# Patient Record
Sex: Male | Born: 1935 | Race: White | Hispanic: No | Marital: Married | Smoking: Never smoker
Health system: Southern US, Community
[De-identification: ages and names within clinical notes are randomized; demographics above are authoritative.]

## PROBLEM LIST (undated history)

## (undated) DIAGNOSIS — K4090 Unilateral inguinal hernia, without obstruction or gangrene, not specified as recurrent: Secondary | ICD-10-CM

## (undated) DIAGNOSIS — M720 Palmar fascial fibromatosis [Dupuytren]: Secondary | ICD-10-CM

## (undated) DIAGNOSIS — K219 Gastro-esophageal reflux disease without esophagitis: Secondary | ICD-10-CM

## (undated) DIAGNOSIS — J309 Allergic rhinitis, unspecified: Secondary | ICD-10-CM

## (undated) DIAGNOSIS — M47817 Spondylosis without myelopathy or radiculopathy, lumbosacral region: Secondary | ICD-10-CM

## (undated) DIAGNOSIS — E785 Hyperlipidemia, unspecified: Secondary | ICD-10-CM

## (undated) DIAGNOSIS — N189 Chronic kidney disease, unspecified: Secondary | ICD-10-CM

## (undated) DIAGNOSIS — I10 Essential (primary) hypertension: Secondary | ICD-10-CM

## (undated) DIAGNOSIS — B029 Zoster without complications: Secondary | ICD-10-CM

## (undated) DIAGNOSIS — I509 Heart failure, unspecified: Secondary | ICD-10-CM

## (undated) DIAGNOSIS — M4120 Other idiopathic scoliosis, site unspecified: Secondary | ICD-10-CM

## (undated) DIAGNOSIS — G629 Polyneuropathy, unspecified: Secondary | ICD-10-CM

## (undated) DIAGNOSIS — K648 Other hemorrhoids: Secondary | ICD-10-CM

## (undated) DIAGNOSIS — I2510 Atherosclerotic heart disease of native coronary artery without angina pectoris: Secondary | ICD-10-CM

## (undated) DIAGNOSIS — R7303 Prediabetes: Secondary | ICD-10-CM

## (undated) DIAGNOSIS — C61 Malignant neoplasm of prostate: Secondary | ICD-10-CM

## (undated) DIAGNOSIS — M109 Gout, unspecified: Secondary | ICD-10-CM

## (undated) HISTORY — DX: Zoster without complications: B02.9

## (undated) HISTORY — DX: Other idiopathic scoliosis, site unspecified: M41.20

## (undated) HISTORY — DX: Heart failure, unspecified: I50.9

## (undated) HISTORY — DX: Essential (primary) hypertension: I10

## (undated) HISTORY — PX: VASECTOMY: SHX75

## (undated) HISTORY — DX: Palmar fascial fibromatosis (dupuytren): M72.0

## (undated) HISTORY — DX: Spondylosis without myelopathy or radiculopathy, lumbosacral region: M47.817

## (undated) HISTORY — DX: Allergic rhinitis, unspecified: J30.9

## (undated) HISTORY — DX: Chronic kidney disease, unspecified: N18.9

## (undated) HISTORY — DX: Gout, unspecified: M10.9

## (undated) HISTORY — PX: HERNIA REPAIR: SHX51

## (undated) HISTORY — DX: Atherosclerotic heart disease of native coronary artery without angina pectoris: I25.10

## (undated) HISTORY — DX: Gastro-esophageal reflux disease without esophagitis: K21.9

## (undated) HISTORY — DX: Other hemorrhoids: K64.8

## (undated) HISTORY — DX: Malignant neoplasm of prostate: C61

## (undated) HISTORY — PX: EYE SURGERY: SHX253

## (undated) HISTORY — DX: Unilateral inguinal hernia, without obstruction or gangrene, not specified as recurrent: K40.90

## (undated) HISTORY — DX: Hyperlipidemia, unspecified: E78.5

## (undated) HISTORY — PX: TYMPANIC MEMBRANE REPAIR: SHX294

---

## 1999-12-16 DIAGNOSIS — J3089 Other allergic rhinitis: Secondary | ICD-10-CM | POA: Insufficient documentation

## 2000-01-07 DIAGNOSIS — M1A0710 Idiopathic chronic gout, right ankle and foot, without tophus (tophi): Secondary | ICD-10-CM | POA: Insufficient documentation

## 2000-09-04 DIAGNOSIS — K219 Gastro-esophageal reflux disease without esophagitis: Secondary | ICD-10-CM

## 2001-10-27 DIAGNOSIS — E785 Hyperlipidemia, unspecified: Secondary | ICD-10-CM

## 2001-10-27 DIAGNOSIS — E7800 Pure hypercholesterolemia, unspecified: Secondary | ICD-10-CM | POA: Insufficient documentation

## 2001-11-17 DIAGNOSIS — K635 Polyp of colon: Secondary | ICD-10-CM | POA: Insufficient documentation

## 2005-06-01 DIAGNOSIS — K4090 Unilateral inguinal hernia, without obstruction or gangrene, not specified as recurrent: Secondary | ICD-10-CM | POA: Insufficient documentation

## 2005-09-01 DIAGNOSIS — B029 Zoster without complications: Secondary | ICD-10-CM | POA: Insufficient documentation

## 2005-10-17 DIAGNOSIS — M720 Palmar fascial fibromatosis [Dupuytren]: Secondary | ICD-10-CM | POA: Insufficient documentation

## 2006-11-16 DIAGNOSIS — K648 Other hemorrhoids: Secondary | ICD-10-CM | POA: Insufficient documentation

## 2009-08-29 DIAGNOSIS — Z8719 Personal history of other diseases of the digestive system: Secondary | ICD-10-CM | POA: Insufficient documentation

## 2009-08-29 DIAGNOSIS — Z9852 Vasectomy status: Secondary | ICD-10-CM | POA: Insufficient documentation

## 2009-08-29 DIAGNOSIS — Z9622 Myringotomy tube(s) status: Secondary | ICD-10-CM | POA: Insufficient documentation

## 2009-08-31 DIAGNOSIS — I10 Essential (primary) hypertension: Secondary | ICD-10-CM | POA: Diagnosis present

## 2010-12-16 DIAGNOSIS — Z79899 Other long term (current) drug therapy: Secondary | ICD-10-CM | POA: Insufficient documentation

## 2011-05-04 DIAGNOSIS — M0600 Rheumatoid arthritis without rheumatoid factor, unspecified site: Secondary | ICD-10-CM | POA: Insufficient documentation

## 2012-01-01 DIAGNOSIS — H903 Sensorineural hearing loss, bilateral: Secondary | ICD-10-CM | POA: Insufficient documentation

## 2012-01-01 DIAGNOSIS — H6120 Impacted cerumen, unspecified ear: Secondary | ICD-10-CM | POA: Insufficient documentation

## 2013-02-02 DIAGNOSIS — M419 Scoliosis, unspecified: Secondary | ICD-10-CM | POA: Insufficient documentation

## 2013-02-02 DIAGNOSIS — M47816 Spondylosis without myelopathy or radiculopathy, lumbar region: Secondary | ICD-10-CM | POA: Insufficient documentation

## 2013-03-17 DIAGNOSIS — C61 Malignant neoplasm of prostate: Secondary | ICD-10-CM | POA: Insufficient documentation

## 2013-04-13 DIAGNOSIS — M1990 Unspecified osteoarthritis, unspecified site: Secondary | ICD-10-CM | POA: Insufficient documentation

## 2013-04-13 DIAGNOSIS — C7951 Secondary malignant neoplasm of bone: Secondary | ICD-10-CM | POA: Insufficient documentation

## 2013-05-31 DIAGNOSIS — D709 Neutropenia, unspecified: Secondary | ICD-10-CM | POA: Insufficient documentation

## 2013-11-09 DIAGNOSIS — R600 Localized edema: Secondary | ICD-10-CM | POA: Insufficient documentation

## 2014-01-19 DIAGNOSIS — S62102A Fracture of unspecified carpal bone, left wrist, initial encounter for closed fracture: Secondary | ICD-10-CM | POA: Insufficient documentation

## 2015-04-12 DIAGNOSIS — M7022 Olecranon bursitis, left elbow: Secondary | ICD-10-CM | POA: Insufficient documentation

## 2015-05-07 DIAGNOSIS — K5900 Constipation, unspecified: Secondary | ICD-10-CM | POA: Insufficient documentation

## 2016-05-16 DIAGNOSIS — N1832 Chronic kidney disease, stage 3b: Secondary | ICD-10-CM | POA: Insufficient documentation

## 2016-09-26 DIAGNOSIS — M160 Bilateral primary osteoarthritis of hip: Secondary | ICD-10-CM | POA: Insufficient documentation

## 2018-08-29 DIAGNOSIS — R7303 Prediabetes: Secondary | ICD-10-CM | POA: Insufficient documentation

## 2018-10-01 DIAGNOSIS — R000 Tachycardia, unspecified: Secondary | ICD-10-CM | POA: Insufficient documentation

## 2019-09-28 DIAGNOSIS — I491 Atrial premature depolarization: Secondary | ICD-10-CM | POA: Insufficient documentation

## 2019-09-28 DIAGNOSIS — I493 Ventricular premature depolarization: Secondary | ICD-10-CM | POA: Insufficient documentation

## 2019-10-07 ENCOUNTER — Encounter: Payer: Self-pay | Admitting: Cardiology

## 2019-11-01 ENCOUNTER — Encounter: Payer: Self-pay | Admitting: *Deleted

## 2019-11-02 ENCOUNTER — Ambulatory Visit (INDEPENDENT_AMBULATORY_CARE_PROVIDER_SITE_OTHER): Payer: Medicare Other | Admitting: Cardiology

## 2019-11-02 ENCOUNTER — Telehealth: Payer: Self-pay | Admitting: Cardiology

## 2019-11-02 ENCOUNTER — Other Ambulatory Visit (INDEPENDENT_AMBULATORY_CARE_PROVIDER_SITE_OTHER): Payer: Medicare Other

## 2019-11-02 ENCOUNTER — Other Ambulatory Visit: Payer: Self-pay

## 2019-11-02 ENCOUNTER — Encounter: Payer: Self-pay | Admitting: Cardiology

## 2019-11-02 ENCOUNTER — Encounter: Payer: Self-pay | Admitting: *Deleted

## 2019-11-02 VITALS — BP 150/78 | HR 50 | Ht 71.0 in | Wt 224.0 lb

## 2019-11-02 DIAGNOSIS — I493 Ventricular premature depolarization: Secondary | ICD-10-CM

## 2019-11-02 DIAGNOSIS — I10 Essential (primary) hypertension: Secondary | ICD-10-CM

## 2019-11-02 DIAGNOSIS — R0602 Shortness of breath: Secondary | ICD-10-CM | POA: Diagnosis not present

## 2019-11-02 NOTE — Progress Notes (Signed)
Cardiology Office Note  Date: 11/02/2019   ID: Mitchell [name], DOB [birth year], MRN [id number]  PCP:  Eber Hong, MD  Cardiologist:  Rozann Lesches, MD Electrophysiologist:  None   Chief Complaint  Patient presents with  . Cardiac evaluation    History of Present Illness: Mitchell [name] is an 86 y.o. male referred for cardiology consultation by Dr. Brynda Greathouse for evaluation of of PACs and PVCs.  His wife is a patient of mine.  He was seen for a wellness check when it was noted that his heart rate was somewhat rapid and irregular.  ECG was obtained as noted below.  I personally reviewed the ECG from February per Dr. Brynda Greathouse which shows sinus rhythm with borderline prolonged PR interval, frequent PVCs with general pattern of bigeminy.  Repeat tracing on the same day showed sinus rhythm with a single PAC.  He tells me that he is not specifically aware of any sense of palpitations.  He has been chronically fatigued, he relates this to some degree to treatment for prostate cancer.  He feels short of breath and at least within the last several weeks has had intermittent episodes of left-sided chest discomfort.  He does not report any exertional component specifically.  He has had no syncope.  I reviewed his medications which are outlined below.  He reports compliance and no intolerances.  I reviewed outside lab work that he presented today.  He also had recent lab work per PCP which I reviewed below.  Past Medical History:  Diagnosis Date  . Allergic rhinitis   . Dupuytren's contracture    Right hand  . Essential hypertension   . GERD (gastroesophageal reflux disease)   . Gout   . Herpes zoster   . Hyperlipidemia   . Inguinal hernia    Right sided  . Internal hemorrhoids   . Lumbosacral spondylosis   . Prostate cancer [facility])     Past Surgical History:  Procedure Laterality Date  . HERNIA REPAIR    . TYMPANIC MEMBRANE REPAIR    . VASECTOMY      Current Outpatient Medications   Medication Sig Dispense Refill  . albuterol (VENTOLIN HFA) 108 (90 Base) MCG/ACT inhaler Inhale into the lungs every 6 (six) hours as needed for wheezing or shortness of breath.    . allopurinol (ZYLOPRIM) 100 MG tablet Take 200 mg by mouth daily.     Marland Kitchen amLODipine (NORVASC) 2.5 MG tablet Take 2.5 mg by mouth daily.    Marland Kitchen aspirin EC 81 MG tablet Take 81 mg by mouth daily.    . colchicine 0.6 MG tablet Take 0.6 mg by mouth daily.    . fluticasone (FLONASE) 50 MCG/ACT nasal spray Place into both nostrils daily.    Marland Kitchen gabapentin (NEURONTIN) 300 MG capsule Take 600 mg by mouth 3 (three) times daily.     . hydrochlorothiazide (HYDRODIURIL) 25 MG tablet Take 25 mg by mouth 2 (two) times daily.     . indomethacin (INDOCIN) 25 MG capsule Take 25 mg by mouth 2 (two) times daily with a meal.    . leuprolide, 6 Month, (ELIGARD) 45 MG injection Inject 45 mg into the skin every 6 (six) months.    . meclizine (ANTIVERT) 25 MG tablet Take 25 mg by mouth 3 (three) times daily as needed for dizziness.    . naproxen (NAPROSYN) 500 MG tablet Take 500 mg by mouth 2 (two) times daily with a meal.    . predniSONE (DELTASONE)  5 MG tablet Take 5 mg by mouth daily with breakfast.     No current facility-administered medications for this visit.   Allergies:  Lisinopril, Losartan, and Penicillins   Social History: The patient  reports that he has never smoked. He has never used smokeless tobacco. He reports current alcohol use. He reports that he does not use drugs.   Family History: The patient's family history includes CAD in his brother and father; Cancer in his sister; Heart failure in his father and mother; Stroke in his brother.   ROS:   No orthopnea or PND.  Physical Exam: VS:  BP (!) 150/78   Pulse (!) 50   Ht 5\' 11"  (1.803 m)   Wt 224 lb (101.6 kg)   SpO2 98%   BMI 31.24 kg/m , BMI Body mass index is 31.24 kg/m.  Wt Readings from Last 3 Encounters:  11/02/19 224 lb (101.6 kg)    General: Elderly  male, no distress. HEENT: Conjunctiva and lids normal, wearing a mask. Neck: Supple, no elevated JVP or carotid bruits, no thyromegaly. Lungs: Clear to auscultation, nonlabored breathing at rest. Cardiac: RRR with frequent ectopy, no S3, soft systolic murmur. Abdomen: Protuberant, nontender, bowel sounds present. Extremities: Mild lower leg edema, distal pulses 2+. Skin: Warm and dry. Musculoskeletal: No kyphosis. Neuropsychiatric: Alert and oriented x3, affect grossly appropriate.  ECG:  No old tracings for comparison.  Recent Labwork:  February 2021: Potassium 3.7, BUN 21, creatinine 1.24, AST 19, ALT 16, cholesterol 218, HDL 52, LDL 136, triglycerides 155, hemoglobin 15.1, platelets 212, TSH 2.21, hemoglobin A1c 6.2%  Other Studies Reviewed Today:  No prior cardiac testing for review.  Assessment and Plan:  1.  Irregular heartbeat with documentation of ventricular ectopy by screening ECG.  He is not specifically aware of any sense of palpitations and has had no syncope.  In light of his fatigue and intermittent chest discomfort, we will pursue further work-up to include a 24-hour monitor to quantify PVCs, echocardiogram for cardiac structural assessment, and a Lexiscan Myoview for ischemic evaluation.  No changes in medications for now.  2.  Essential hypertension, currently Norvasc and hydrochlorothiazide.  Medication Adjustments/Labs and Tests Ordered: Current medicines are reviewed at length with the patient today.  Concerns regarding medicines are outlined above.   Tests Ordered: Orders Placed This Encounter  Procedures  . NM Myocar Multi W/Spect W/Wall Motion / EF  . LONG TERM MONITOR (3-14 DAYS)  . ECHOCARDIOGRAM COMPLETE    Medication Changes: No orders of the defined types were placed in this encounter.   Disposition:  Follow up test results and determine next step.  Signed, Satira Sark, MD, [facility] 11/02/2019 1:47 PM    [place]  at [place], [place], [place] [address] Phone: [phone]; Fax: [phone]

## 2019-11-02 NOTE — Patient Instructions (Addendum)
Medication Instructions:   Your physician recommends that you continue on your current medications as directed. Please refer to the Current Medication list given to you today.  Labwork:  NONE  Testing/Procedures: Your physician has recommended that you wear a holter monitor for 24 hours. Holter monitors are medical devices that record the heart's electrical activity. Doctors most often use these monitors to diagnose arrhythmias. Arrhythmias are problems with the speed or rhythm of the heartbeat. The monitor is a small, portable device. You can wear one while you do your normal daily activities. This is usually used to diagnose what is causing palpitations/syncope (passing out). [employer] will contact you about this monitor Your physician has requested that you have an echocardiogram. Echocardiography is a painless test that uses sound waves to create images of your heart. It provides your doctor with information about the size and shape of your heart and how well your heart's chambers and valves are working. This procedure takes approximately one hour. There are no restrictions for this procedure. Your physician has requested that you have a lexiscan myoview. For further information please visit [email or web address]. Please follow instruction sheet, as given.  Follow-Up:  Your physician recommends that you schedule a follow-up appointment in: pending.  Any Other Special Instructions Will Be Listed Below (If Applicable).  If you need a refill on your cardiac medications before your next appointment, please call your pharmacy.

## 2019-11-02 NOTE — Telephone Encounter (Signed)
Pre-cert Verification for the following procedure    LONG TERM MONITOR (3-14 DAYS)  LEXISCAN MYOVIEW  2 D ECHO    DATE:11/11/2019  LOCATION: [facility]

## 2019-11-11 ENCOUNTER — Encounter (HOSPITAL_COMMUNITY): Payer: Self-pay

## 2019-11-11 ENCOUNTER — Other Ambulatory Visit: Payer: Self-pay

## 2019-11-11 ENCOUNTER — Ambulatory Visit (HOSPITAL_COMMUNITY)
Admission: RE | Admit: 2019-11-11 | Discharge: 2019-11-11 | Disposition: A | Discharge status: Home / Self Care | Payer: Medicare Other | Source: Ambulatory Visit | Attending: Cardiology | Admitting: Cardiology

## 2019-11-11 ENCOUNTER — Ambulatory Visit (HOSPITAL_BASED_OUTPATIENT_CLINIC_OR_DEPARTMENT_OTHER)
Admission: RE | Admit: 2019-11-11 | Discharge: 2019-11-11 | Disposition: A | Discharge status: Home / Self Care | Payer: Medicare Other | Source: Ambulatory Visit | Attending: Cardiology | Admitting: Cardiology

## 2019-11-11 DIAGNOSIS — R0602 Shortness of breath: Secondary | ICD-10-CM

## 2019-11-11 LAB — NM MYOCAR MULTI W/SPECT W/WALL MOTION / EF
LV dias vol: 79 mL (ref 62–150)
LV sys vol: 38 mL
Peak HR: 104 {beats}/min
RATE: 0.35
Rest HR: 88 {beats}/min
SDS: 2
SRS: 0
SSS: 2
TID: 0.92

## 2019-11-11 MED ORDER — REGADENOSON 0.4 MG/5ML IV SOLN
INTRAVENOUS | Status: AC
  Administered 2019-11-11: 0.4 mg via INTRAVENOUS
  Filled 2019-11-11: qty 5

## 2019-11-11 MED ORDER — PERFLUTREN LIPID MICROSPHERE
1.0000 mL | INTRAVENOUS | Status: AC | PRN
  Administered 2019-11-11: 2 mL via INTRAVENOUS
  Filled 2019-11-11: qty 10

## 2019-11-11 MED ORDER — TECHNETIUM TC 99M TETROFOSMIN IV KIT
30.0000 | PACK | Freq: Once | INTRAVENOUS | Status: AC | PRN
  Administered 2019-11-11: 30.6 via INTRAVENOUS

## 2019-11-11 MED ORDER — TECHNETIUM TC 99M TETROFOSMIN IV KIT
10.0000 | PACK | Freq: Once | INTRAVENOUS | Status: AC | PRN
  Administered 2019-11-11: 11 via INTRAVENOUS

## 2019-11-11 MED ORDER — SODIUM CHLORIDE FLUSH 0.9 % IV SOLN
INTRAVENOUS | Status: AC
  Administered 2019-11-11: 10 mL via INTRAVENOUS
  Filled 2019-11-11: qty 10

## 2019-11-11 NOTE — Progress Notes (Signed)
*  PRELIMINARY RESULTS* Echocardiogram 2D Echocardiogram has been performed with Definity.  Samuel Germany 11/11/2019, 1:34 PM

## 2019-11-14 ENCOUNTER — Telehealth: Payer: Self-pay | Admitting: Cardiology

## 2019-11-14 NOTE — Telephone Encounter (Signed)
Patient called asking for test results.

## 2019-11-15 ENCOUNTER — Telehealth: Payer: Self-pay | Admitting: *Deleted

## 2019-11-15 MED ORDER — ACEBUTOLOL HCL 200 MG PO CAPS: ORAL_CAPSULE | ORAL | 3 refills | Status: DC

## 2019-11-15 NOTE — Telephone Encounter (Signed)
-----   Message from Satira Sark, MD sent at 11/13/2019  7:02 PM EDT ----- Results reviewed.  Myoview does not indicate any ischemia to suggest obstructive CAD.  LVEF was mildly reduced although calculated in the setting of frequent PVCs so this may not be accurate.  Will review echocardiogram results.

## 2019-11-15 NOTE — Telephone Encounter (Signed)
-----   Message from Satira Sark, MD sent at 11/13/2019  7:04 PM EDT ----- Results reviewed.  Echocardiogram shows LVEF low normal range at 50 to 55%, no wall motion abnormalities.  Aortic valve is calcified and sclerotic but not stenotic.  Overall reassuring in the setting of PVCs, although have not seen monitor results as yet.  Please make sure that monitor has been entered for review.

## 2019-11-15 NOTE — Telephone Encounter (Signed)
That is right, I'm sorry for the confusion.  Let's go with Acebutolol 200 mg twice daily.

## 2019-11-15 NOTE — Telephone Encounter (Signed)
Per pharmacist, acebutolol comes in 200 mg or 400 mg dose per capsule.

## 2019-11-15 NOTE — Telephone Encounter (Signed)
-----   Message from Satira Sark, MD sent at 11/15/2019  9:22 AM EDT ----- Results reviewed.  Cardiac monitor shows that overall rhythm is normal.  He does have brief episodes of SVT but also relatively frequent PVCs.  Fortunately, LVEF is low normal range by echocardiogram and his Myoview did not indicate any significant ischemia.  Let's try Acebutolol 100 mg twice daily to suppress PVCs and see if he notices any difference.  Schedule an office follow-up in the next 6 weeks.

## 2019-11-15 NOTE — Telephone Encounter (Signed)
Patient informed and verbalized understanding of plan. Copy sent to PCP 

## 2019-11-16 MED ORDER — ACEBUTOLOL HCL 200 MG PO CAPS: 200.0000 mg | ORAL_CAPSULE | Freq: Two times a day (BID) | ORAL | 3 refills | Status: DC

## 2019-11-21 ENCOUNTER — Telehealth: Payer: Self-pay | Admitting: Cardiology

## 2019-11-21 NOTE — Telephone Encounter (Signed)
Patient informed and verbalized understanding of plan. Follow up appointment rescheduled for a sooner visit. Patient request to see MD. Appointment now 12/09/2019 @3 :40 pm [place] office. Advised to contact our office in a few days to update [place] on his symptoms. Advised if symptoms get worse, to go to the ED for an evaluation. Verbalized understanding of plan.

## 2019-11-21 NOTE — Telephone Encounter (Signed)
Thank you for the update.  I am sorry that he is not tolerating acebutolol, it is already at the low lowest dose, so I would go ahead and stop it for now.  We can give this a few days and then consider starting bisoprolol 2.5 mg once daily, he may do better with that.  Make sure that his follow-up has already been arranged as well.  I am concerned that he is still having chest pain however, although his Myoview was low risk.  We may need to pursue further cardiac testing if this continues.

## 2019-11-21 NOTE — Telephone Encounter (Signed)
Reports since starting acebutolol 200 mg twice on 11/16/2019, c/o being SOB, dizzy, blurred vision and fatigue. Has not checked BP or HR. Reports active SOB and dizziness. BP & HR checked while on phone: BP 148/106 & HR 64 left arm--135/79 & HR 60 right arm--BP 122/79 & HR 60 left arm. Reports chest pain that he's had for awhile and is not any worse. Reports staying well hydrated and eating okay. Reports that he has not taken the acebutolol today. Advised to continue holding acebutolol until we contact him back with further instructions. Advised if symptoms got worse, to go to the ED for an evaluation. Verbalized understanding of plan.

## 2019-11-21 NOTE — Telephone Encounter (Signed)
Trouble breathing biggest issue along with being  tired, muscle aches and trouble walking from one room to the next since starting new medication

## 2019-11-29 ENCOUNTER — Telehealth: Payer: Self-pay | Admitting: *Deleted

## 2019-11-29 ENCOUNTER — Encounter: Payer: Self-pay | Admitting: Cardiology

## 2019-11-29 NOTE — Telephone Encounter (Signed)
EKG done today and showed long Q-T interval and first degree heart block. Please call PA to discuss

## 2019-11-29 NOTE — Telephone Encounter (Signed)
I called the number back and it went to voicemail.  I am in clinic all afternoon, if you could track down the PA that called from [place] I am happy to speak with them.  It would be best if we can get a copy of the ECG as well.  I am aware that he has frequent PVCs from his work-up so far.

## 2019-12-01 ENCOUNTER — Encounter: Payer: Self-pay | Admitting: *Deleted

## 2019-12-02 ENCOUNTER — Other Ambulatory Visit: Payer: Self-pay

## 2019-12-02 ENCOUNTER — Other Ambulatory Visit: Payer: Self-pay | Admitting: Cardiology

## 2019-12-02 ENCOUNTER — Encounter: Payer: Self-pay | Admitting: Cardiology

## 2019-12-02 ENCOUNTER — Telehealth: Payer: Self-pay | Admitting: Cardiology

## 2019-12-02 ENCOUNTER — Ambulatory Visit (INDEPENDENT_AMBULATORY_CARE_PROVIDER_SITE_OTHER): Payer: Medicare Other | Admitting: Cardiology

## 2019-12-02 VITALS — BP 142/98 | HR 72 | Ht 71.0 in | Wt 227.0 lb

## 2019-12-02 DIAGNOSIS — Z01810 Encounter for preprocedural cardiovascular examination: Secondary | ICD-10-CM | POA: Diagnosis not present

## 2019-12-02 DIAGNOSIS — R0609 Other forms of dyspnea: Secondary | ICD-10-CM

## 2019-12-02 DIAGNOSIS — I200 Unstable angina: Secondary | ICD-10-CM | POA: Diagnosis not present

## 2019-12-02 DIAGNOSIS — I10 Essential (primary) hypertension: Secondary | ICD-10-CM | POA: Diagnosis not present

## 2019-12-02 DIAGNOSIS — I493 Ventricular premature depolarization: Secondary | ICD-10-CM | POA: Diagnosis not present

## 2019-12-02 DIAGNOSIS — R0600 Dyspnea, unspecified: Secondary | ICD-10-CM

## 2019-12-02 MED ORDER — SODIUM CHLORIDE 0.9% FLUSH: 3.0000 mL | Freq: Two times a day (BID) | INTRAVENOUS | Status: DC

## 2019-12-02 MED ORDER — BISOPROLOL FUMARATE 5 MG PO TABS: 2.5000 mg | ORAL_TABLET | Freq: Every day | ORAL | 2 refills | Status: DC

## 2019-12-02 NOTE — H&P (View-Only) (Signed)
Cardiology Office Note  Date: 12/02/2019   ID: DEVYNN CHESS, DOB Jan 29, 1934, MRN [id number]  PCP:  Eber Hong, MD  Cardiologist:  Rozann Lesches, MD Electrophysiologist:  None   Chief Complaint  Patient presents with  . Cardiac follow-up    History of Present Illness: Mitchell [name] is an 86 y.o. male that I saw recently in consultation in March.  He is here with his wife for a follow-up visit.  We discussed his interval cardiac testing.  Echocardiogram from April showed LVEF 50 to 55% with possible basal septal inferior hypokinesis, mild diastolic dysfunction, normal RV contraction, sclerotic aortic valve without stenosis.  Monitor demonstrated sinus rhythm with brief bursts of SVT, also frequent PVCs representing 9.2% of total beats, some ventricular bigeminy, no sustained arrhythmias.  We tried him on acebutolol which he did not tolerate.  States that he was fatigued and had headaches.  He had a recent follow-up visit at [facility] for history of prostate cancer.  During that visit he had an ECG obtained which he brought for me to review today.  Tracing shows sinus rhythm with ventricular bigeminy, increased voltage.  Incorrectly reported long QT interval.  In terms of symptoms he continues to report dyspnea on exertion, intermittent chest tightness concerning for angina.  He has had no sudden dizziness or syncope.  I reviewed his medications which are outlined below.  We discussed further cardiac testing for evaluation of potential underlying ischemic heart disease.  We reviewed the risks and benefits of a cardiac catheterization and he is in agreement to proceed.  Past Medical History:  Diagnosis Date  . Allergic rhinitis   . Dupuytren's contracture    Right hand  . Essential hypertension   . GERD (gastroesophageal reflux disease)   . Gout   . Herpes zoster   . Hyperlipidemia   . Inguinal hernia    Right sided  . Internal hemorrhoids   . Lumbosacral spondylosis   . Prostate  cancer [facility])     Past Surgical History:  Procedure Laterality Date  . HERNIA REPAIR    . TYMPANIC MEMBRANE REPAIR    . VASECTOMY      Current Outpatient Medications  Medication Sig Dispense Refill  . albuterol (VENTOLIN HFA) 108 (90 Base) MCG/ACT inhaler Inhale into the lungs every 6 (six) hours as needed for wheezing or shortness of breath.    . allopurinol (ZYLOPRIM) 100 MG tablet Take 200 mg by mouth daily.     Marland Kitchen amLODipine (NORVASC) 2.5 MG tablet Take 2.5 mg by mouth daily.    Marland Kitchen aspirin EC 81 MG tablet Take 81 mg by mouth daily.    . colchicine 0.6 MG tablet Take 0.6 mg by mouth daily.    . fluticasone (FLONASE) 50 MCG/ACT nasal spray Place into both nostrils daily.    Marland Kitchen gabapentin (NEURONTIN) 300 MG capsule Take 600 mg by mouth 3 (three) times daily.     . hydrochlorothiazide (HYDRODIURIL) 25 MG tablet Take 25 mg by mouth 2 (two) times daily.     . indomethacin (INDOCIN) 25 MG capsule Take 25 mg by mouth 2 (two) times daily with a meal.    . leuprolide, 6 Month, (ELIGARD) 45 MG injection Inject 45 mg into the skin every 6 (six) months.    . meclizine (ANTIVERT) 25 MG tablet Take 25 mg by mouth 3 (three) times daily as needed for dizziness.    . naproxen (NAPROSYN) 500 MG tablet Take 500 mg by mouth 2 (  two) times daily with a meal.    . predniSONE (DELTASONE) 5 MG tablet Take 5 mg by mouth daily with breakfast.    . bisoprolol (ZEBETA) 5 MG tablet Take 0.5 tablets (2.5 mg total) by mouth daily. 15 tablet 2   Current Facility-Administered Medications  Medication Dose Route Frequency Provider Last Rate Last Admin  . sodium chloride flush (NS) 0.9 % injection 3 mL  3 mL Intravenous Q12H Satira Sark, MD       Allergies:  Lisinopril, Losartan, and Penicillins   Social History: The patient  reports that he has never smoked. He has never used smokeless tobacco. He reports current alcohol use. He reports that he does not use drugs.   Family History: The patient's family history  includes CAD in his brother and father; Cancer in his sister; Heart failure in his father and mother; Stroke in his brother.   ROS:   Mechanical fall, had large skin abrasion to top of his head requiring stitches, these are being removed next Wednesday.  Physical Exam: VS:  BP (!) 142/98   Pulse 72   Ht 5\' 11"  (1.803 m)   Wt 227 lb (103 kg)   SpO2 98%   BMI 31.66 kg/m , BMI Body mass index is 31.66 kg/m.  Wt Readings from Last 3 Encounters:  12/02/19 227 lb (103 kg)  11/02/19 224 lb (101.6 kg)    General: Elderly male, appears comfortable at rest. HEENT: Conjunctiva and lids normal, wearing a mask. Neck: Supple, no elevated JVP or carotid bruits, no thyromegaly. Lungs: Clear to auscultation, nonlabored breathing at rest. Cardiac: Regular rate and rhythm with ectopy, no S3, soft systolic murmur, no pericardial rub. Abdomen: Soft, nontender, bowel sounds present. Extremities: Mild lower leg edema, distal pulses 2+. Skin: Warm and dry. Musculoskeletal: No kyphosis. Neuropsychiatric: Alert and oriented x3, affect grossly appropriate.  ECG:  No old tracings for review.  Recent Labwork:  February 2021: Potassium 3.7, BUN 21, creatinine 1.24, AST 19, ALT 16, cholesterol 218, HDL 52, LDL 136, triglycerides 155, hemoglobin 15.1, platelets 212, TSH 2.21, hemoglobin A1c 6.2%  Other Studies Reviewed Today:  Echocardiogram 11/11/2019: 1. Abnormal septal motion Inferior basal hypokinesis . Left ventricular  ejection fraction, by estimation, is 50 to 55%. The left ventricle has low  normal function. The left ventricle has no regional wall motion  abnormalities. Left ventricular diastolic  parameters are consistent with Grade I diastolic dysfunction (impaired  relaxation).  2. Right ventricular systolic function is normal. The right ventricular  size is normal.  3. Left atrial size was mildly dilated.  4. The mitral valve is normal in structure. No evidence of mitral valve   regurgitation. No evidence of mitral stenosis.  5. The aortic valve is tricuspid. Aortic valve regurgitation is trivial.  Mild to moderate aortic valve sclerosis/calcification is present, without  any evidence of aortic stenosis.  6. The inferior vena cava is normal in size with greater than 50%  respiratory variability, suggesting right atrial pressure of 3 mmHg.   Cardiac monitor 11/03/2019: ZIO AT reviewed.  1 day 3 hours analyzed.  Predominant rhythm is sinus with heart rate ranging from 63 bpm up to 121 bpm and average heart rate 85 bpm.  Rare PACs were noted representing less than 1% of total beats.  Brief bursts of SVT were noted, the longest was only 10 beats.  There were also frequent isolated PVCs representing 9.2% of total beats.  Some ventricular bigeminy was noted.  No sustained  arrhythmias were noted.  There were no pauses.  Assessment and Plan:  1.  Dyspnea on exertion and intermittent chest tightness concerning for accelerating angina in an 86 year old male with hypertension, hyperlipidemia, and frequent PVCs.  LVEF 50 to 55% with question of basal inferior hypokinesis.  We have discussed the risks and benefits of a diagnostic cardiac catheterization and he is in agreement to proceed.  Continue aspirin, Norvasc, and HCTZ.  We will try bisoprolol at 2.5 mg daily.  2.  Frequent PVCs, 9.2% of total beats by recent cardiac monitor.  No NSVT or VT.  Adding low-dose beta-blocker in the form of bisoprolol, he did not tolerate Acebutolol.  Assessing coronary anatomy for ischemic substrate as noted above.  3.  Reported history of hyperlipidemia, diet managed by PCP with LDL 136 as of February.  Statin therapy will be indicated if CAD diagnosed.  Medication Adjustments/Labs and Tests Ordered: Current medicines are reviewed at length with the patient today.  Concerns regarding medicines are outlined above.   Tests Ordered: Orders Placed This Encounter  Procedures  . Basic metabolic  panel  . CBC    Medication Changes: Meds ordered this encounter  Medications  . bisoprolol (ZEBETA) 5 MG tablet    Sig: Take 0.5 tablets (2.5 mg total) by mouth daily.    Dispense:  15 tablet    Refill:  2    12/02/2019 NEW    Disposition:  Follow up after procedure.  Signed, Satira Sark, MD, [facility] 12/02/2019 10:40 AM    [address] at [place], [place], [place] [address] Phone: [phone]; Fax: [phone]

## 2019-12-02 NOTE — Progress Notes (Signed)
Cardiology Office Note  Date: 12/02/2019   ID: DREVEN ALTERI, DOB Jun 04, 1934, MRN [id number]  PCP:  Eber Hong, MD  Cardiologist:  Rozann Lesches, MD Electrophysiologist:  None   Chief Complaint  Patient presents with  . Cardiac follow-up    History of Present Illness: Mitchell [name] is an 86 y.o. male that I saw recently in consultation in March.  He is here with his wife for a follow-up visit.  We discussed his interval cardiac testing.  Echocardiogram from April showed LVEF 50 to 55% with possible basal septal inferior hypokinesis, mild diastolic dysfunction, normal RV contraction, sclerotic aortic valve without stenosis.  Monitor demonstrated sinus rhythm with brief bursts of SVT, also frequent PVCs representing 9.2% of total beats, some ventricular bigeminy, no sustained arrhythmias.  We tried him on acebutolol which he did not tolerate.  States that he was fatigued and had headaches.  He had a recent follow-up visit at [facility] for history of prostate cancer.  During that visit he had an ECG obtained which he brought for me to review today.  Tracing shows sinus rhythm with ventricular bigeminy, increased voltage.  Incorrectly reported long QT interval.  In terms of symptoms he continues to report dyspnea on exertion, intermittent chest tightness concerning for angina.  He has had no sudden dizziness or syncope.  I reviewed his medications which are outlined below.  We discussed further cardiac testing for evaluation of potential underlying ischemic heart disease.  We reviewed the risks and benefits of a cardiac catheterization and he is in agreement to proceed.  Past Medical History:  Diagnosis Date  . Allergic rhinitis   . Dupuytren's contracture    Right hand  . Essential hypertension   . GERD (gastroesophageal reflux disease)   . Gout   . Herpes zoster   . Hyperlipidemia   . Inguinal hernia    Right sided  . Internal hemorrhoids   . Lumbosacral spondylosis   . Prostate  cancer [facility])     Past Surgical History:  Procedure Laterality Date  . HERNIA REPAIR    . TYMPANIC MEMBRANE REPAIR    . VASECTOMY      Current Outpatient Medications  Medication Sig Dispense Refill  . albuterol (VENTOLIN HFA) 108 (90 Base) MCG/ACT inhaler Inhale into the lungs every 6 (six) hours as needed for wheezing or shortness of breath.    . allopurinol (ZYLOPRIM) 100 MG tablet Take 200 mg by mouth daily.     Marland Kitchen amLODipine (NORVASC) 2.5 MG tablet Take 2.5 mg by mouth daily.    Marland Kitchen aspirin EC 81 MG tablet Take 81 mg by mouth daily.    . colchicine 0.6 MG tablet Take 0.6 mg by mouth daily.    . fluticasone (FLONASE) 50 MCG/ACT nasal spray Place into both nostrils daily.    Marland Kitchen gabapentin (NEURONTIN) 300 MG capsule Take 600 mg by mouth 3 (three) times daily.     . hydrochlorothiazide (HYDRODIURIL) 25 MG tablet Take 25 mg by mouth 2 (two) times daily.     . indomethacin (INDOCIN) 25 MG capsule Take 25 mg by mouth 2 (two) times daily with a meal.    . leuprolide, 6 Month, (ELIGARD) 45 MG injection Inject 45 mg into the skin every 6 (six) months.    . meclizine (ANTIVERT) 25 MG tablet Take 25 mg by mouth 3 (three) times daily as needed for dizziness.    . naproxen (NAPROSYN) 500 MG tablet Take 500 mg by mouth 2 (  two) times daily with a meal.    . predniSONE (DELTASONE) 5 MG tablet Take 5 mg by mouth daily with breakfast.    . bisoprolol (ZEBETA) 5 MG tablet Take 0.5 tablets (2.5 mg total) by mouth daily. 15 tablet 2   Current Facility-Administered Medications  Medication Dose Route Frequency Provider Last Rate Last Admin  . sodium chloride flush (NS) 0.9 % injection 3 mL  3 mL Intravenous Q12H Satira Sark, MD       Allergies:  Lisinopril, Losartan, and Penicillins   Social History: The patient  reports that he has never smoked. He has never used smokeless tobacco. He reports current alcohol use. He reports that he does not use drugs.   Family History: The patient's family history  includes CAD in his brother and father; Cancer in his sister; Heart failure in his father and mother; Stroke in his brother.   ROS:   Mechanical fall, had large skin abrasion to top of his head requiring stitches, these are being removed next Wednesday.  Physical Exam: VS:  BP (!) 142/98   Pulse 72   Ht 5\' 11"  (1.803 m)   Wt 227 lb (103 kg)   SpO2 98%   BMI 31.66 kg/m , BMI Body mass index is 31.66 kg/m.  Wt Readings from Last 3 Encounters:  12/02/19 227 lb (103 kg)  11/02/19 224 lb (101.6 kg)    General: Elderly male, appears comfortable at rest. HEENT: Conjunctiva and lids normal, wearing a mask. Neck: Supple, no elevated JVP or carotid bruits, no thyromegaly. Lungs: Clear to auscultation, nonlabored breathing at rest. Cardiac: Regular rate and rhythm with ectopy, no S3, soft systolic murmur, no pericardial rub. Abdomen: Soft, nontender, bowel sounds present. Extremities: Mild lower leg edema, distal pulses 2+. Skin: Warm and dry. Musculoskeletal: No kyphosis. Neuropsychiatric: Alert and oriented x3, affect grossly appropriate.  ECG:  No old tracings for review.  Recent Labwork:  February 2021: Potassium 3.7, BUN 21, creatinine 1.24, AST 19, ALT 16, cholesterol 218, HDL 52, LDL 136, triglycerides 155, hemoglobin 15.1, platelets 212, TSH 2.21, hemoglobin A1c 6.2%  Other Studies Reviewed Today:  Echocardiogram 11/11/2019: 1. Abnormal septal motion Inferior basal hypokinesis . Left ventricular  ejection fraction, by estimation, is 50 to 55%. The left ventricle has low  normal function. The left ventricle has no regional wall motion  abnormalities. Left ventricular diastolic  parameters are consistent with Grade I diastolic dysfunction (impaired  relaxation).  2. Right ventricular systolic function is normal. The right ventricular  size is normal.  3. Left atrial size was mildly dilated.  4. The mitral valve is normal in structure. No evidence of mitral valve    regurgitation. No evidence of mitral stenosis.  5. The aortic valve is tricuspid. Aortic valve regurgitation is trivial.  Mild to moderate aortic valve sclerosis/calcification is present, without  any evidence of aortic stenosis.  6. The inferior vena cava is normal in size with greater than 50%  respiratory variability, suggesting right atrial pressure of 3 mmHg.   Cardiac monitor 11/03/2019: ZIO AT reviewed.  1 day 3 hours analyzed.  Predominant rhythm is sinus with heart rate ranging from 63 bpm up to 121 bpm and average heart rate 85 bpm.  Rare PACs were noted representing less than 1% of total beats.  Brief bursts of SVT were noted, the longest was only 10 beats.  There were also frequent isolated PVCs representing 9.2% of total beats.  Some ventricular bigeminy was noted.  No  sustained arrhythmias were noted.  There were no pauses.  Assessment and Plan:  1.  Dyspnea on exertion and intermittent chest tightness concerning for accelerating angina in an 86 year old male with hypertension, hyperlipidemia, and frequent PVCs.  LVEF 50 to 55% with question of basal inferior hypokinesis.  We have discussed the risks and benefits of a diagnostic cardiac catheterization and he is in agreement to proceed.  Continue aspirin, Norvasc, and HCTZ.  We will try bisoprolol at 2.5 mg daily.  2.  Frequent PVCs, 9.2% of total beats by recent cardiac monitor.  No NSVT or VT.  Adding low-dose beta-blocker in the form of bisoprolol, he did not tolerate Acebutolol.  Assessing coronary anatomy for ischemic substrate as noted above.  3.  Reported history of hyperlipidemia, diet managed by PCP with LDL 136 as of February.  Statin therapy will be indicated if CAD diagnosed.  Medication Adjustments/Labs and Tests Ordered: Current medicines are reviewed at length with the patient today.  Concerns regarding medicines are outlined above.   Tests Ordered: Orders Placed This Encounter  Procedures  . Basic metabolic  panel  . CBC    Medication Changes: Meds ordered this encounter  Medications  . bisoprolol (ZEBETA) 5 MG tablet    Sig: Take 0.5 tablets (2.5 mg total) by mouth daily.    Dispense:  15 tablet    Refill:  2    12/02/2019 NEW    Disposition:  Follow up after procedure.  Signed, Satira Sark, MD, [facility] 12/02/2019 10:40 AM    [facility] [place], [place], [place] [address] Phone: [phone]; Fax: [phone]

## 2019-12-02 NOTE — Patient Instructions (Addendum)
Medication Instructions:   Your physician has recommended you make the following change in your medication:   Start bisoprolol 2.5 mg by mouth twice daily.  Continue other medications the same  Labwork: Your physician recommends that you return for non-fasting lab work on 12/06/2019 at [facility] in [place] before your covid test. No appointment is needed.-Patient aware that lab work isn't needed-lab available in [place] from [place] dated 11/29/2019 Your covid test is scheduled for Tuesday, 12/06/19 @2 :05 pm. [place], [place]. You will need to go home and quarantine until your heart cath is completed.   Testing/Procedures: Your physician has requested that you have a cardiac catheterization. Cardiac catheterization is used to diagnose and/or treat various heart conditions. Doctors may recommend this procedure for a number of different reasons. The most common reason is to evaluate chest pain. Chest pain can be a symptom of coronary artery disease (CAD), and cardiac catheterization can show whether plaque is narrowing or blocking your heart's arteries. This procedure is also used to evaluate the valves, as well as measure the blood flow and oxygen levels in different parts of your heart. For further information please visit [email or web address]. Please follow instruction sheet, as given.  Follow-Up:  Your physician recommends that you schedule a follow-up appointment in: 1 month (office).  Any Other Special Instructions Will Be Listed Below (If Applicable).     [place] [place] [address] Dept: [phone] Loc: [phone]  LEONCIO KERL  12/02/2019  You are scheduled for a Cardiac Catheterization on Thursday, April 29 with Dr. Glenetta Hew.  1. Please arrive at the [facility] (Main  Entrance A) at [facility]: [address] [place],  [address] at 7:00 AM (This time is two hours before your procedure to ensure your preparation). Free valet parking service is available.   Special note: Every effort is made to have your procedure done on time. Please understand that emergencies sometimes delay scheduled procedures.  2. Diet: Do not eat solid foods after midnight.  The patient may have clear liquids until 5am upon the day of the procedure.  3. Labs: You will need to have blood drawn on Tuesday, April 27 at [place]. [address], [place]  Open: 7am - 6pm, Sat 8am - 12 noon   Phone: [phone]. You do not need to be fasting. Patient aware that lab work isn't needed-lab available in [place] from [place] dated 11/29/2019  4. Medication instructions in preparation for your procedure: Hold morning dose of hydrochlorothiazide before your cath. You may take the rest of your medications that morning with a sip of water.   Contrast Allergy: No  On the morning of your procedure, take your Aspirin 81 mg and any morning medicines NOT listed above.  You may use sips of water.  5. Plan for one night stay--bring personal belongings. 6. Bring a current list of your medications and current insurance cards. 7. You MUST have a responsible person to drive you home. 8. Someone MUST be with you the first 24 hours after you arrive home or your discharge will be delayed. 9. Please wear clothes that are easy to get on and off and wear slip-on shoes.  Thank you for allowing [place] to care for you!   -- [facility]  If you need  before your next appointment, please call your pharmacy. 

## 2019-12-02 NOTE — Telephone Encounter (Signed)
Pre-cert Verification for the following procedure     Left heart cath dx: accelerating angina, DOE and frequent PVC's on Thursday December 08, 2019 @9 :00 am with Dr. Ellyn Hack

## 2019-12-06 ENCOUNTER — Other Ambulatory Visit (HOSPITAL_COMMUNITY)
Admission: RE | Admit: 2019-12-06 | Discharge: 2019-12-06 | Disposition: A | Discharge status: Home / Self Care | Payer: Medicare Other | Source: Ambulatory Visit | Attending: Cardiology | Admitting: Cardiology

## 2019-12-06 ENCOUNTER — Other Ambulatory Visit: Payer: Self-pay

## 2019-12-06 DIAGNOSIS — Z01812 Encounter for preprocedural laboratory examination: Secondary | ICD-10-CM | POA: Insufficient documentation

## 2019-12-06 DIAGNOSIS — Z20822 Contact with and (suspected) exposure to covid-19: Secondary | ICD-10-CM | POA: Diagnosis not present

## 2019-12-07 ENCOUNTER — Telehealth: Payer: Self-pay | Admitting: *Deleted

## 2019-12-07 LAB — SARS CORONAVIRUS 2 (TAT 6-24 HRS): SARS Coronavirus 2: NEGATIVE

## 2019-12-07 NOTE — Telephone Encounter (Signed)
Pt contacted pre-catheterization scheduled at [facility] for: Thursday December 08, 2019 9 AM Verified arrival time and place: [facility]) at: 7 AM   No solid food after midnight prior to cath, clear liquids until 5 AM day of procedure.  Hold: HCTZ-day before and day of procedure-GFR 50    Except hold medication AM meds can be  taken pre-cath with sip of water including: ASA 81 mg   Confirmed patient has responsible adult to drive home post procedure and observe 24 hours after arriving home: yes  You are allowed ONE visitor in the waiting room during your procedures. Both you and your visitor must wear masks.      COVID-19 Pre-Screening Questions:  . In the past 7 to 10 days have you had a cough,  shortness of breath, headache, congestion, fever (100 or greater) body aches, chills, sore throat, or sudden loss of taste or sense of smell? Shortness of breath-not new . Have you been around anyone with known Covid 19 in the past 7 to 10 days? no . Have you been around anyone who is awaiting Covid 19 test results in the past 7 to 10 days? no . Have you been around anyone who has mentioned symptoms of Covid 19 within the past 7 to 10 days? no  Reviewed procedure/mask/visitor instructions, COVID 19 screening questions with patient.  11/29/19 BMP/CBC - Care Everywhere [facility].

## 2019-12-08 ENCOUNTER — Encounter (HOSPITAL_COMMUNITY)
Admission: RE | Disposition: A | Discharge status: Home / Self Care | Payer: Self-pay | Source: Home / Self Care | Attending: Cardiology

## 2019-12-08 ENCOUNTER — Ambulatory Visit (HOSPITAL_COMMUNITY)
Admission: RE | Admit: 2019-12-08 | Discharge: 2019-12-08 | Disposition: A | Discharge status: Home / Self Care | Payer: Medicare Other | Attending: Cardiology | Admitting: Cardiology

## 2019-12-08 ENCOUNTER — Other Ambulatory Visit: Payer: Self-pay

## 2019-12-08 DIAGNOSIS — I471 Supraventricular tachycardia: Secondary | ICD-10-CM | POA: Insufficient documentation

## 2019-12-08 DIAGNOSIS — Z7982 Long term (current) use of aspirin: Secondary | ICD-10-CM | POA: Insufficient documentation

## 2019-12-08 DIAGNOSIS — K219 Gastro-esophageal reflux disease without esophagitis: Secondary | ICD-10-CM | POA: Insufficient documentation

## 2019-12-08 DIAGNOSIS — E785 Hyperlipidemia, unspecified: Secondary | ICD-10-CM | POA: Insufficient documentation

## 2019-12-08 DIAGNOSIS — I2584 Coronary atherosclerosis due to calcified coronary lesion: Secondary | ICD-10-CM | POA: Diagnosis not present

## 2019-12-08 DIAGNOSIS — I10 Essential (primary) hypertension: Secondary | ICD-10-CM | POA: Diagnosis not present

## 2019-12-08 DIAGNOSIS — Z791 Long term (current) use of non-steroidal anti-inflammatories (NSAID): Secondary | ICD-10-CM | POA: Diagnosis not present

## 2019-12-08 DIAGNOSIS — Z79899 Other long term (current) drug therapy: Secondary | ICD-10-CM | POA: Diagnosis not present

## 2019-12-08 DIAGNOSIS — I200 Unstable angina: Secondary | ICD-10-CM

## 2019-12-08 DIAGNOSIS — Z880 Allergy status to penicillin: Secondary | ICD-10-CM | POA: Insufficient documentation

## 2019-12-08 DIAGNOSIS — Z7952 Long term (current) use of systemic steroids: Secondary | ICD-10-CM | POA: Insufficient documentation

## 2019-12-08 DIAGNOSIS — I25110 Atherosclerotic heart disease of native coronary artery with unstable angina pectoris: Secondary | ICD-10-CM | POA: Diagnosis not present

## 2019-12-08 DIAGNOSIS — I493 Ventricular premature depolarization: Secondary | ICD-10-CM | POA: Insufficient documentation

## 2019-12-08 DIAGNOSIS — M109 Gout, unspecified: Secondary | ICD-10-CM | POA: Insufficient documentation

## 2019-12-08 DIAGNOSIS — Z8249 Family history of ischemic heart disease and other diseases of the circulatory system: Secondary | ICD-10-CM | POA: Insufficient documentation

## 2019-12-08 DIAGNOSIS — R0600 Dyspnea, unspecified: Secondary | ICD-10-CM | POA: Insufficient documentation

## 2019-12-08 DIAGNOSIS — Z888 Allergy status to other drugs, medicaments and biological substances status: Secondary | ICD-10-CM | POA: Diagnosis not present

## 2019-12-08 DIAGNOSIS — Z9852 Vasectomy status: Secondary | ICD-10-CM | POA: Insufficient documentation

## 2019-12-08 DIAGNOSIS — I208 Other forms of angina pectoris: Secondary | ICD-10-CM | POA: Clinically undetermined

## 2019-12-08 DIAGNOSIS — R0609 Other forms of dyspnea: Secondary | ICD-10-CM | POA: Diagnosis present

## 2019-12-08 HISTORY — PX: INTRAVASCULAR PRESSURE WIRE/FFR STUDY: CATH118243

## 2019-12-08 HISTORY — PX: LEFT HEART CATH AND CORONARY ANGIOGRAPHY: CATH118249

## 2019-12-08 LAB — BASIC METABOLIC PANEL
Anion gap: 11 (ref 5–15)
BUN: 25 mg/dL — ABNORMAL HIGH (ref 8–23)
CO2: 26 mmol/L (ref 22–32)
Calcium: 9.5 mg/dL (ref 8.9–10.3)
Chloride: 104 mmol/L (ref 98–111)
Creatinine, Ser: 1.41 mg/dL — ABNORMAL HIGH (ref 0.61–1.24)
GFR calc Af Amer: 53 mL/min — ABNORMAL LOW (ref >60)
GFR calc non Af Amer: 46 mL/min — ABNORMAL LOW (ref >60)
Glucose, Bld: 94 mg/dL (ref 70–99)
Potassium: 3.9 mmol/L (ref 3.5–5.1)
Sodium: 141 mmol/L (ref 135–145)

## 2019-12-08 LAB — CBC
HCT: 44.2 % (ref 39.0–52.0)
Hemoglobin: 14.2 g/dL (ref 13.0–17.0)
MCH: 29.4 pg (ref 26.0–34.0)
MCHC: 32.1 g/dL (ref 30.0–36.0)
MCV: 91.5 fL (ref 80.0–100.0)
Platelets: 226 10*3/uL (ref 150–400)
RBC: 4.83 MIL/uL (ref 4.22–5.81)
RDW: 15 % (ref 11.5–15.5)
WBC: 8.2 10*3/uL (ref 4.0–10.5)
nRBC: 0 % (ref 0.0–0.2)

## 2019-12-08 LAB — POCT ACTIVATED CLOTTING TIME
Activated Clotting Time: 219 seconds
Activated Clotting Time: 301 seconds

## 2019-12-08 SURGERY — LEFT HEART CATH AND CORONARY ANGIOGRAPHY
Anesthesia: LOCAL

## 2019-12-08 MED ORDER — VERAPAMIL HCL 2.5 MG/ML IV SOLN
INTRAVENOUS | Status: DC | PRN
  Administered 2019-12-08: 10 mL via INTRA_ARTERIAL

## 2019-12-08 MED ORDER — SODIUM CHLORIDE 0.9% FLUSH: 3.0000 mL | INTRAVENOUS | Status: DC | PRN

## 2019-12-08 MED ORDER — NITROGLYCERIN 1 MG/10 ML FOR IR/CATH LAB
INTRA_ARTERIAL | Status: DC | PRN
  Administered 2019-12-08 (×3): 200 ug via INTRACORONARY

## 2019-12-08 MED ORDER — MIDAZOLAM HCL 2 MG/2ML IJ SOLN
INTRAMUSCULAR | Status: AC
  Filled 2019-12-08: qty 2

## 2019-12-08 MED ORDER — SODIUM CHLORIDE 0.9 % IV SOLN: 250.0000 mL | INTRAVENOUS | Status: DC | PRN

## 2019-12-08 MED ORDER — SODIUM CHLORIDE 0.9% FLUSH: 3.0000 mL | Freq: Two times a day (BID) | INTRAVENOUS | Status: DC

## 2019-12-08 MED ORDER — ACETAMINOPHEN 325 MG PO TABS: 650.0000 mg | ORAL_TABLET | ORAL | Status: DC | PRN

## 2019-12-08 MED ORDER — HYDRALAZINE HCL 20 MG/ML IJ SOLN: 10.0000 mg | INTRAMUSCULAR | Status: DC | PRN

## 2019-12-08 MED ORDER — IOHEXOL 350 MG/ML SOLN
INTRAVENOUS | Status: DC | PRN
  Administered 2019-12-08: 170 mL

## 2019-12-08 MED ORDER — LABETALOL HCL 5 MG/ML IV SOLN: 10.0000 mg | INTRAVENOUS | Status: DC | PRN

## 2019-12-08 MED ORDER — SODIUM CHLORIDE 0.9 % WEIGHT BASED INFUSION: 1.0000 mL/kg/h | INTRAVENOUS | Status: DC

## 2019-12-08 MED ORDER — HEPARIN (PORCINE) IN NACL 1000-0.9 UT/500ML-% IV SOLN
INTRAVENOUS | Status: DC | PRN
  Administered 2019-12-08 (×2): 500 mL

## 2019-12-08 MED ORDER — LIDOCAINE HCL (PF) 1 % IJ SOLN
INTRAMUSCULAR | Status: DC | PRN
  Administered 2019-12-08: 2 mL

## 2019-12-08 MED ORDER — SODIUM CHLORIDE 0.9 % WEIGHT BASED INFUSION
3.0000 mL/kg/h | INTRAVENOUS | Status: AC
  Administered 2019-12-08: 08:00:00 3 mL/kg/h via INTRAVENOUS

## 2019-12-08 MED ORDER — MIDAZOLAM HCL 2 MG/2ML IJ SOLN
INTRAMUSCULAR | Status: DC | PRN
  Administered 2019-12-08: 2 mg via INTRAVENOUS

## 2019-12-08 MED ORDER — FENTANYL CITRATE (PF) 100 MCG/2ML IJ SOLN
INTRAMUSCULAR | Status: AC
  Filled 2019-12-08: qty 2

## 2019-12-08 MED ORDER — ONDANSETRON HCL 4 MG/2ML IJ SOLN: 4.0000 mg | Freq: Four times a day (QID) | INTRAMUSCULAR | Status: DC | PRN

## 2019-12-08 MED ORDER — VERAPAMIL HCL 2.5 MG/ML IV SOLN
INTRAVENOUS | Status: AC
  Filled 2019-12-08: qty 2

## 2019-12-08 MED ORDER — HEPARIN SODIUM (PORCINE) 1000 UNIT/ML IJ SOLN
INTRAMUSCULAR | Status: DC | PRN
  Administered 2019-12-08: 4000 [IU] via INTRAVENOUS
  Administered 2019-12-08 (×2): 5000 [IU] via INTRAVENOUS

## 2019-12-08 MED ORDER — BISOPROLOL FUMARATE 5 MG PO TABS
2.5000 mg | ORAL_TABLET | ORAL | Status: AC
  Administered 2019-12-08: 2.5 mg via ORAL
  Filled 2019-12-08: qty 0.5

## 2019-12-08 MED ORDER — ADENOSINE 12 MG/4ML IV SOLN
INTRAVENOUS | Status: AC
  Filled 2019-12-08: qty 16

## 2019-12-08 MED ORDER — ADENOSINE (DIAGNOSTIC) 140MCG/KG/MIN
INTRAVENOUS | Status: DC | PRN
  Administered 2019-12-08: 140 ug/kg/min via INTRAVENOUS

## 2019-12-08 MED ORDER — HEPARIN SODIUM (PORCINE) 1000 UNIT/ML IJ SOLN
INTRAMUSCULAR | Status: AC
  Filled 2019-12-08: qty 1

## 2019-12-08 MED ORDER — FENTANYL CITRATE (PF) 100 MCG/2ML IJ SOLN
INTRAMUSCULAR | Status: DC | PRN
  Administered 2019-12-08: 25 ug via INTRAVENOUS

## 2019-12-08 MED ORDER — ASPIRIN 81 MG PO CHEW
81.0000 mg | CHEWABLE_TABLET | ORAL | Status: AC
  Administered 2019-12-08: 81 mg via ORAL
  Filled 2019-12-08: qty 1

## 2019-12-08 MED ORDER — LIDOCAINE HCL (PF) 1 % IJ SOLN
INTRAMUSCULAR | Status: AC
  Filled 2019-12-08: qty 30

## 2019-12-08 MED ORDER — HEPARIN (PORCINE) IN NACL 1000-0.9 UT/500ML-% IV SOLN
INTRAVENOUS | Status: AC
  Filled 2019-12-08: qty 500

## 2019-12-08 MED ORDER — NITROGLYCERIN 1 MG/10 ML FOR IR/CATH LAB
INTRA_ARTERIAL | Status: AC
  Filled 2019-12-08: qty 10

## 2019-12-08 MED ORDER — SODIUM CHLORIDE 0.9 % IV SOLN: INTRAVENOUS | Status: DC

## 2019-12-08 SURGICAL SUPPLY — 16 items
CATH INFINITI 5 FR 3DRC (CATHETERS) ×2 IMPLANT
CATH LAUNCHER 5F JR4 (CATHETERS) ×2 IMPLANT
CATH LAUNCHER 6FR AR1 SH (CATHETERS) ×2 IMPLANT
CATH OPTITORQUE TIG 4.0 5F (CATHETERS) ×2 IMPLANT
CATH VISTA GUIDE 6FR XBLAD3.5 (CATHETERS) ×2 IMPLANT
DEVICE RAD COMP TR BAND LRG (VASCULAR PRODUCTS) ×2 IMPLANT
GLIDESHEATH SLEND SS 6F .021 (SHEATH) ×2 IMPLANT
GUIDEWIRE INQWIRE 1.5J.035X260 (WIRE) ×1 IMPLANT
GUIDEWIRE PRESSURE X 175 (WIRE) ×2 IMPLANT
INQWIRE 1.5J .035X260CM (WIRE) ×2
KIT ESSENTIALS PG (KITS) ×2 IMPLANT
KIT HEART LEFT (KITS) ×2 IMPLANT
PACK CARDIAC CATHETERIZATION (CUSTOM PROCEDURE TRAY) ×2 IMPLANT
SHEATH PROBE COVER 6X72 (BAG) ×2 IMPLANT
TRANSDUCER W/STOPCOCK (MISCELLANEOUS) ×2 IMPLANT
TUBING CIL FLEX 10 FLL-RA (TUBING) ×2 IMPLANT

## 2019-12-08 NOTE — Discharge Instructions (Signed)
Radial Site Care  This sheet gives you information about how to care for yourself after your procedure. Your health care provider may also give you more specific instructions. If you have problems or questions, contact your health care provider. What can I expect after the procedure? After the procedure, it is common to have:  Bruising and tenderness at the catheter insertion area. Follow these instructions at home: Medicines  Take over-the-counter and prescription medicines only as told by your health care provider. Insertion site care  Follow instructions from your health care provider about how to take care of your insertion site. Make sure you: ? Wash your hands with soap and water before you change your bandage (dressing). If soap and water are not available, use hand sanitizer. ? Change your dressing as told by your health care provider. ? Leave stitches (sutures), skin glue, or adhesive strips in place. These skin closures may need to stay in place for 2 weeks or longer. If adhesive strip edges start to loosen and curl up, you may trim the loose edges. Do not remove adhesive strips completely unless your health care provider tells you to do that.  Check your insertion site every day for signs of infection. Check for: ? Redness, swelling, or pain. ? Fluid or blood. ? Pus or a bad smell. ? Warmth.  Do not take baths, swim, or use a hot tub until your health care provider approves.  You may shower 24-48 hours after the procedure, or as directed by your health care provider. ? Remove the dressing and gently wash the site with plain soap and water. ? Pat the area dry with a clean towel. ? Do not rub the site. That could cause bleeding.  Do not apply powder or lotion to the site. Activity   For 24 hours after the procedure, or as directed by your health care provider: ? Do not flex or bend the affected arm. ? Do not push or pull heavy objects with the affected arm. ? Do not  drive yourself home from the hospital or clinic. You may drive 24 hours after the procedure unless your health care provider tells you not to. ? Do not operate machinery or power tools.  Do not lift anything that is heavier than 10 lb (4.5 kg), or the limit that you are told, until your health care provider says that it is safe.  Ask your health care provider when it is okay to: ? Return to work or school. ? Resume usual physical activities or sports. ? Resume sexual activity. General instructions  If the catheter site starts to bleed, raise your arm and put firm pressure on the site. If the bleeding does not stop, get help right away. This is a medical emergency.  If you went home on the same day as your procedure, a responsible adult should be with you for the first 24 hours after you arrive home.  Keep all follow-up visits as told by your health care provider. This is important. Contact a health care provider if:  You have a fever.  You have redness, swelling, or yellow drainage around your insertion site. Get help right away if:  You have unusual pain at the radial site.  The catheter insertion area swells very fast.  The insertion area is bleeding, and the bleeding does not stop when you hold steady pressure on the area.  Your arm or hand becomes pale, cool, tingly, or numb. These symptoms may represent a serious problem   that is an emergency. Do not wait to see if the symptoms will go away. Get medical help right away. Call your local emergency services (911 in the [place] Do not drive yourself to the hospital. Summary  After the procedure, it is common to have bruising and tenderness at the site.  Follow instructions from your health care provider about how to take care of your radial site wound. Check the wound every day for signs of infection.  Do not lift anything that is heavier than 10 lb (4.5 kg), or the limit that you are told, until your health care provider says  that it is safe. This information is not intended to replace advice given to you by your health care provider. Make sure you discuss any questions you have with your health care provider. Document Revised: 09/02/2017 Document Reviewed: 09/02/2017 Elsevier Patient Education  2020 [employer] Inc.  

## 2019-12-08 NOTE — Progress Notes (Signed)
Pharmacy was called/ need beta blocker order sent/ stat was placed,

## 2019-12-08 NOTE — Interval H&P Note (Signed)
History and Physical Interval Note:  12/08/2019 9:14 AM  Mitchell [name]  has presented today for surgery, with the diagnosis of Angina, dyspnea on exertion, PVC's.  The various methods of treatment have been discussed with the patient and family. After consideration of risks, benefits and other options for treatment, the patient has consented to  Procedure(s): LEFT HEART CATH AND CORONARY ANGIOGRAPHY (N/A)  PERCUTANEOUS CORONARY INTERVENTION   as a surgical intervention.  The patient's history has been reviewed, patient examined, no change in status, stable for surgery.  I have reviewed the patient's chart and labs.  Questions were answered to the patient's satisfaction.    Cath Lab Visit (complete for each Cath Lab visit)  Clinical Evaluation Leading to the Procedure:   ACS: No.  Non-ACS:    Anginal Classification: CCS III  Anti-ischemic medical therapy: Maximal Therapy (2 or more classes of medications)  Non-Invasive Test Results: Low-risk stress test findings: cardiac mortality <1%/year; equivocal results  Prior CABG: No previous CABG   Glenetta Hew

## 2019-12-08 NOTE — Progress Notes (Addendum)
Received call from central tele stating pt having increased trigeminal PVC's , Callie PA / cardiology was called, order followed for bisoprolol and she will update Dr Ellyn Hack.  [place] saw pt, pt able to DC

## 2019-12-09 ENCOUNTER — Telehealth: Payer: Self-pay | Admitting: Cardiology

## 2019-12-09 ENCOUNTER — Ambulatory Visit: Payer: Medicare Other | Admitting: Cardiology

## 2019-12-09 DIAGNOSIS — I2510 Atherosclerotic heart disease of native coronary artery without angina pectoris: Secondary | ICD-10-CM | POA: Diagnosis present

## 2019-12-09 MED ORDER — ISOSORBIDE MONONITRATE ER 30 MG PO TB24
15.0000 mg | ORAL_TABLET | Freq: Every day | ORAL | 1 refills | Status: DC
Start: 2019-12-09 — End: 2020-05-28

## 2019-12-09 NOTE — Telephone Encounter (Signed)
Pt confirmed he was only taking 2.5 mg of bisoprolol daily not bid - Imdur sent to pharmacy and scheduled appt with NP on 5/5 - pt says he was told with recent labs that he was dehydrated and that his kidney function was abnormal and was concerned with taking hctz - was originally given this by provider at [facility] who recommended pt contact us regarding if he should continue this - labs from yesterday are in Epic

## 2019-12-09 NOTE — Telephone Encounter (Signed)
I reviewed the cardiac catheterization report and also discussed the case with Dr. Ellyn Hack who did not feel like revascularization (stent or surgical) would necessarily improve his symptoms.  This leaves [place] with medical therapy adjustments as tolerated for now.  Bisoprolol 2.5 mg should only be taken once a day.  Let's add Imdur beginning at 15 mg once in the evening, continue Norvasc.  Go ahead and get him a sooner appointment in the office, he can see Jonni Sanger.

## 2019-12-09 NOTE — Telephone Encounter (Signed)
Cath results came back good.   Still Short of breath and weak. Would like to talk to someone about what he should do

## 2019-12-09 NOTE — Telephone Encounter (Signed)
Pt voiced understanding - updated updated medication list

## 2019-12-09 NOTE — Telephone Encounter (Signed)
Pt c/o being weak/SOB/some dizziness/daily palpitations - started taking bisoprolol 2.5 mg bid says BP ranging 140s/90s doesn't know what HR has been - has appt in June, dose he need sooner appt with NP ?

## 2019-12-09 NOTE — Telephone Encounter (Signed)
If his current medication list is correct he is on HCTZ 25 mg twice daily.  I would certainly cut this back to 12.5 mg daily at this point.

## 2019-12-14 ENCOUNTER — Telehealth (INDEPENDENT_AMBULATORY_CARE_PROVIDER_SITE_OTHER): Payer: Medicare Other | Admitting: Family Medicine

## 2019-12-14 ENCOUNTER — Encounter: Payer: Self-pay | Admitting: Family Medicine

## 2019-12-14 VITALS — BP 140/90 | HR 80 | Ht 71.0 in | Wt 223.6 lb

## 2019-12-14 DIAGNOSIS — R0681 Apnea, not elsewhere classified: Secondary | ICD-10-CM | POA: Diagnosis not present

## 2019-12-14 DIAGNOSIS — R0683 Snoring: Secondary | ICD-10-CM

## 2019-12-14 DIAGNOSIS — I10 Essential (primary) hypertension: Secondary | ICD-10-CM

## 2019-12-14 DIAGNOSIS — I2510 Atherosclerotic heart disease of native coronary artery without angina pectoris: Secondary | ICD-10-CM | POA: Diagnosis not present

## 2019-12-14 DIAGNOSIS — G4710 Hypersomnia, unspecified: Secondary | ICD-10-CM

## 2019-12-14 NOTE — Progress Notes (Deleted)
Virtual Visit via Telephone Note   This visit type was conducted due to national recommendations for restrictions regarding the COVID-19 Pandemic (e.g. social distancing) in an effort to limit this patient's exposure and mitigate transmission in our community.  Due to his co-morbid illnesses, this patient is at least at moderate risk for complications without adequate follow up.  This format is felt to be most appropriate for this patient at this time.  The patient did not have access to video technology/had technical difficulties with video requiring transitioning to audio format only (telephone).  All issues noted in this document were discussed and addressed.  No physical exam could be performed with this format.  Please refer to the patient's chart for his  consent to telehealth for [facility].   The patient was identified using 2 identifiers.  Date:  12/14/2019   ID:  Mitchell [name], DOB Feb 24, 1934, MRN [id number]  Patient Location: Home Provider Location: Office  PCP:  Eber Hong, MD  Cardiologist:  Rozann Lesches, MD  Electrophysiologist:  None   Evaluation Performed:  Follow-Up Visit  Chief Complaint:    History of Present Illness:    Mitchell [name] is a 86 y.o. male with ***  The patient {does/does not:200015} have symptoms concerning for COVID-19 infection (fever, chills, cough, or new shortness of breath).    Past Medical History:  Diagnosis Date  . Allergic rhinitis   . Dupuytren's contracture    Right hand  . Essential hypertension   . GERD (gastroesophageal reflux disease)   . Gout   . Herpes zoster   . Hyperlipidemia   . Inguinal hernia    Right sided  . Internal hemorrhoids   . Lumbosacral spondylosis   . Prostate cancer [facility])    Past Surgical History:  Procedure Laterality Date  . HERNIA REPAIR    . INTRAVASCULAR PRESSURE WIRE/FFR STUDY N/A 12/08/2019   Procedure: INTRAVASCULAR PRESSURE WIRE/FFR STUDY;  Surgeon: Leonie Man, MD;  Location: [place] [facility];  Service: Cardiovascular;  Laterality: N/A;  . LEFT HEART CATH AND CORONARY ANGIOGRAPHY N/A 12/08/2019   Procedure: LEFT HEART CATH AND CORONARY ANGIOGRAPHY;  Surgeon: Leonie Man, MD;  Location: [place] [facility];  Service: Cardiovascular;  Laterality: N/A;  . TYMPANIC MEMBRANE REPAIR    . VASECTOMY       Current Meds  Medication Sig  . albuterol (VENTOLIN HFA) 108 (90 Base) MCG/ACT inhaler Inhale into the lungs every 6 (six) hours as needed for wheezing or shortness of breath.  . allopurinol (ZYLOPRIM) 100 MG tablet Take 200 mg by mouth daily.   Marland Kitchen amLODipine (NORVASC) 2.5 MG tablet Take 2.5 mg by mouth daily.  Marland Kitchen aspirin EC 81 MG tablet Take 81 mg by mouth daily.  . bisoprolol (ZEBETA) 5 MG tablet Take 0.5 tablets (2.5 mg total) by mouth daily.  . colchicine 0.6 MG tablet Take 0.6 mg by mouth daily as needed (gout).   Marland Kitchen gabapentin (NEURONTIN) 300 MG capsule Take 600 mg by mouth 3 (three) times daily.   . hydrochlorothiazide (MICROZIDE) 12.5 MG capsule Take 12.5 mg by mouth daily.  . indomethacin (INDOCIN) 25 MG capsule Take 25 mg by mouth 2 (two) times daily with a meal.  . isosorbide mononitrate (IMDUR) 30 MG 24 hr tablet Take 0.5 tablets (15 mg total) by mouth daily.  Marland Kitchen leuprolide, 6 Month, (ELIGARD) 45 MG injection Inject 45 mg into the skin every 6 (six) months.  . meclizine (ANTIVERT) 25 MG  tablet Take 25 mg by mouth 3 (three) times daily as needed for dizziness.  . Multiple Vitamins-Minerals (MULTIVITAMIN WITH MINERALS) tablet Take 1 tablet by mouth daily.  . predniSONE (DELTASONE) 5 MG tablet Take 7.5 mg by mouth daily with breakfast.   . Propylene Glycol (SYSTANE BALANCE) 0.6 % SOLN Place 1 drop into both eyes daily as needed (dry eyes).   Current Facility-Administered Medications for the 12/14/19 encounter (Telemedicine) with Verta Ellen., NP  Medication  . sodium chloride flush (NS) 0.9 % injection 3 mL     Allergies:   Lisinopril, Losartan, and  Penicillins   Social History   Tobacco Use  . Smoking status: Never Smoker  . Smokeless tobacco: Never Used  Substance Use Topics  . Alcohol use: Yes  . Drug use: Never     Family Hx: The patient's family history includes CAD in his brother and father; Cancer in his sister; Heart failure in his father and mother; Stroke in his brother.  ROS:   Please see the history of present illness.    *** All other systems reviewed and are negative.   Prior CV studies:   The following studies were reviewed today:  Cardiac catheterization 12/08/2019   The left ventricular systolic function is normal.  The left ventricular ejection fraction is 50-55% by visual estimate.  LV end diastolic pressure is moderately elevated.  There is no aortic valve stenosis.  Prox LAD lesion is 55% stenosed with 60% stenosed side branch in 1st Diag. Both lesions were tested with RFR/DFR and found to be not physiologically significant.  Dist LAD lesion is 60% stenosed.    Angiographically moderate-severe single-vessel disease with ostial LAD eccentrically calcified 55-60% stenosis at takeoff of high branching 1st Diag -> RFR/DFR negative for both LAD and Diag; mid LAD focal 60% and hinge point  Otherwise minimal CAD in a right dominant system.  Normal LVEF and mild-moderately elevated EDP.  Angiographic imaging correlates with noninvasive nuclear stress test imaging. ->  No obvious culprit lesion.  Despite having lesions in the LAD, there was no evidence of anterior ischemia on the Myoview, this correlates with DFR/FFR results.   Echocardiogram 11/11/2019  1. Abnormal septal motion Inferior basal hypokinesis . Left ventricular ejection fraction, by estimation, is 50 to 55%. The left ventricle has low normal function. The left ventricle has no regional wall motion abnormalities. Left ventricular diastolic parameters are consistent with Grade I diastolic dysfunction (impaired relaxation). 2. Right  ventricular systolic function is normal. The right ventricular size is normal. 3. Left atrial size was mildly dilated. 4. The mitral valve is normal in structure. No evidence of mitral valve regurgitation. No evidence of mitral stenosis. 5. The aortic valve is tricuspid. Aortic valve regurgitation is trivial. Mild to moderate aortic valve sclerosis/calcification is present, without any evidence of aortic stenosis. 6. The inferior vena cava is normal in size with greater than 50% respiratory variability, suggesting right atrial pressure of 3 mmHg.  ZIO AT reviewed.  1 day 3 hours analyzed.  Predominant rhythm is sinus with heart rate ranging from 63 bpm up to 121 bpm and average heart rate 85 bpm.  Rare PACs were noted representing less than 1% of total beats.  Brief bursts of SVT were noted, the longest was only 10 beats.  There were also frequent isolated PVCs representing 9.2% of total beats.  Some ventricular bigeminy was noted.  No sustained arrhythmias were noted.  There were no pauses.   Labs/Other Tests and  Data Reviewed:    EKG:  {EKG/Telemetry Strips [phone]}  Recent Labs: 12/08/2019: BUN 25; Creatinine, Ser 1.41; Hemoglobin 14.2; Platelets 226; Potassium 3.9; Sodium 141   Recent Lipid Panel No results found for: CHOL, TRIG, HDL, CHOLHDL, LDLCALC, LDLDIRECT  Wt Readings from Last 3 Encounters:  12/14/19 223 lb 9.6 oz (101.4 kg)  12/08/19 220 lb (99.8 kg)  12/02/19 227 lb (103 kg)     Objective:    Vital Signs:  BP (!) 154/94   Pulse 80   Ht 5\' 11"  (1.803 m)   Wt 223 lb 9.6 oz (101.4 kg)   SpO2 94%   BMI 31.19 kg/m    {Heart[facility] Virtual Exam (Optional):[phone]::"VITAL SIGNS:  reviewed"}  ASSESSMENT & PLAN:    1. ***  COVID-19 Education: The signs and symptoms of COVID-19 were discussed with the patient and how to seek care for testing (follow up with PCP or arrange E-visit).  ***The importance of social distancing was discussed today.  Time:     Today, I have spent *** minutes with the patient with telehealth technology discussing the above problems.     Medication Adjustments/Labs and Tests Ordered: Current medicines are reviewed at length with the patient today.  Concerns regarding medicines are outlined above.   Tests Ordered: No orders of the defined types were placed in this encounter.   Medication Changes: No orders of the defined types were placed in this encounter.   Follow Up:  {F/U Format:[phone]} {follow up:15908}  Signed, Verta Ellen, NP  12/14/2019 3:54 PM    [facility]

## 2019-12-14 NOTE — Progress Notes (Signed)
Date:  12/14/2019   ID:  Mitchell [name], DOB 27-Dec-1933, MRN [id number]  PCP:  Eber Hong, MD  Cardiologist:  Rozann Lesches, MD  Electrophysiologist:  None   Evaluation Performed:  Follow-Up Visit  Chief Complaint:  F/U S/P Cardiac catheterization,HTN, HLD, DOE,Accelerating angina, frequent PVC's, Dyspnea  History of Present Illness:    Mitchell [name] is a 86 y.o. male with history of hypertension, hyperlipidemia, frequent PVCs, dyspnea on exertion and intermittent chest tightness concerning for accelerating angina.  Frequent PVCs.  Last saw Dr. Domenic Polite on 12/02/2019.with c/o accelerating angina.  Patient had a prior echocardiogram with LVEF of 50 to 55% with question of basal inferior hypokinesis.  He was scheduled for cardiac catheterization.  Cardiac catheterization on 12/08/2019 demonstrated LVEF 50 to 55%, LVEDP moderately elevated.  Proximal LAD 55% stenosed with 60% stenosed side branch and first diagonal.  Both lesions were tested for RFR/DFR and found to be not physiologically significant.  Distal LAD lesion was 60% stenosed.  Telephone encounter with patient on 12/09/2019 post cathterization patient continued to complain of shortness of breath and feeling weak, daily palpitations.  He had started taking bisoprolol 2.5 mg  daily and stated blood pressure was ranging in the 140s over 90s.  Dr. Domenic Polite reviewed the telephone note stating he discussed case with Dr. Ellyn Hack. Dr Ellyn Hack did not feel like revascularization (stenting or surgical) would improve his symptoms.   He continued Bisoprolol  2.5 mg once daily.  Imdur 15 mg was started and patient was to continue Norvasc.  Patient stated that he was told regarding recent labs that he was dehydrated and his kidney function was abnormal and was concerned with taking HCTZ.   Dr. Domenic Polite reviewed the patient's current medication list and noticed he was taking HCTZ 25 mg twice daily.  He advised HCTZ needed to be decreased to 12.5 mg  daily.  Patient presents today for follow-up.  He states his only complaint is continued fatigue.  He denies anginal or exertional symptoms.  Denies any dizziness, lightheadedness.  No complaints of dyspnea.  His wife states she believes he may have sleep apnea.  She states he has significant snoring, periods of apnea, waking up at night gasping for breath, and daytime hypersomnolence.  Patient states he knows he is overweight and is not very active on a daily basis.  Patient's wife mentions that PCP had recommended he have a sleep study previously but had not followed up.   The patient does have symptoms concerning for COVID-19 infection (fever, chills, cough, or new shortness of breath).    Past Medical History:  Diagnosis Date   Allergic rhinitis    Dupuytren's contracture    Right hand   Essential hypertension    GERD (gastroesophageal reflux disease)    Gout    Herpes zoster    Hyperlipidemia    Inguinal hernia    Right sided   Internal hemorrhoids    Lumbosacral spondylosis    Prostate cancer [facility])    Past Surgical History:  Procedure Laterality Date   HERNIA REPAIR     INTRAVASCULAR PRESSURE WIRE/FFR STUDY N/A 12/08/2019   Procedure: INTRAVASCULAR PRESSURE WIRE/FFR STUDY;  Surgeon: Leonie Man, MD;  Location: [place] [facility];  Service: Cardiovascular;  Laterality: N/A;   LEFT HEART CATH AND CORONARY ANGIOGRAPHY N/A 12/08/2019   Procedure: LEFT HEART CATH AND CORONARY ANGIOGRAPHY;  Surgeon: Leonie Man, MD;  Location: [place] [facility];  Service: Cardiovascular;  Laterality: N/A;  TYMPANIC MEMBRANE REPAIR     VASECTOMY       Current Meds  Medication Sig   albuterol (VENTOLIN HFA) 108 (90 Base) MCG/ACT inhaler Inhale into the lungs every 6 (six) hours as needed for wheezing or shortness of breath.   allopurinol (ZYLOPRIM) 100 MG tablet Take 200 mg by mouth daily.    amLODipine (NORVASC) 2.5 MG tablet Take 2.5 mg by mouth daily.   aspirin  EC 81 MG tablet Take 81 mg by mouth daily.   bisoprolol (ZEBETA) 5 MG tablet Take 0.5 tablets (2.5 mg total) by mouth daily.   colchicine 0.6 MG tablet Take 0.6 mg by mouth daily as needed (gout).    gabapentin (NEURONTIN) 300 MG capsule Take 600 mg by mouth 3 (three) times daily.    hydrochlorothiazide (MICROZIDE) 12.5 MG capsule Take 12.5 mg by mouth daily.   indomethacin (INDOCIN) 25 MG capsule Take 25 mg by mouth 2 (two) times daily with a meal.   isosorbide mononitrate (IMDUR) 30 MG 24 hr tablet Take 0.5 tablets (15 mg total) by mouth daily.   leuprolide, 6 Month, (ELIGARD) 45 MG injection Inject 45 mg into the skin every 6 (six) months.   meclizine (ANTIVERT) 25 MG tablet Take 25 mg by mouth 3 (three) times daily as needed for dizziness.   Multiple Vitamins-Minerals (MULTIVITAMIN WITH MINERALS) tablet Take 1 tablet by mouth daily.   predniSONE (DELTASONE) 5 MG tablet Take 7.5 mg by mouth daily with breakfast.    Propylene Glycol (SYSTANE BALANCE) 0.6 % SOLN Place 1 drop into both eyes daily as needed (dry eyes).   Current Facility-Administered Medications for the 12/14/19 encounter (Telemedicine) with Verta Ellen., NP  Medication   sodium chloride flush (NS) 0.9 % injection 3 mL     Allergies:   Lisinopril, Losartan, and Penicillins   Social History   Tobacco Use   Smoking status: Never Smoker   Smokeless tobacco: Never Used  Substance Use Topics   Alcohol use: Yes   Drug use: Never     Family Hx: The patient's family history includes CAD in his brother and father; Cancer in his sister; Heart failure in his father and mother; Stroke in his brother.  ROS:   Please see the history of present illness.    All other systems reviewed and are negative.   Prior CV studies:   The following studies were reviewed today:  Cardiac catheterization 12/08/2019   The left ventricular systolic function is normal.  The left ventricular ejection fraction is 50-55%  by visual estimate.  LV end diastolic pressure is moderately elevated.  There is no aortic valve stenosis.  Prox LAD lesion is 55% stenosed with 60% stenosed side branch in 1st Diag. Both lesions were tested with RFR/DFR and found to be not physiologically significant.  Dist LAD lesion is 60% stenosed.    Angiographically moderate-severe single-vessel disease with ostial LAD eccentrically calcified 55-60% stenosis at takeoff of high branching 1st Diag -> RFR/DFR negative for both LAD and Diag; mid LAD focal 60% and hinge point  Otherwise minimal CAD in a right dominant system.  Normal LVEF and mild-moderately elevated EDP.  Angiographic imaging correlates with noninvasive nuclear stress test imaging. ->  No obvious culprit lesion.  Despite having lesions in the LAD, there was no evidence of anterior ischemia on the Myoview, this correlates with DFR/FFR results.   Echocardiogram 11/11/2019  1. Abnormal septal motion Inferior basal hypokinesis . Left ventricular ejection fraction, by estimation, is  50 to 55%. The left ventricle has low normal function. The left ventricle has no regional wall motion abnormalities. Left ventricular diastolic parameters are consistent with Grade I diastolic dysfunction (impaired relaxation). 2. Right ventricular systolic function is normal. The right ventricular size is normal. 3. Left atrial size was mildly dilated. 4. The mitral valve is normal in structure. No evidence of mitral valve regurgitation. No evidence of mitral stenosis. 5. The aortic valve is tricuspid. Aortic valve regurgitation is trivial. Mild to moderate aortic valve sclerosis/calcification is present, without any evidence of aortic stenosis. 6. The inferior vena cava is normal in size with greater than 50% respiratory variability, suggesting right atrial pressure of 3 mmHg.  ZIO AT reviewed.  1 day 3 hours analyzed.  Predominant rhythm is sinus with heart rate ranging from 63 bpm up  to 121 bpm and average heart rate 85 bpm.  Rare PACs were noted representing less than 1% of total beats.  Brief bursts of SVT were noted, the longest was only 10 beats.  There were also frequent isolated PVCs representing 9.2% of total beats.  Some ventricular bigeminy was noted.  No sustained arrhythmias were noted.  There were no pauses.   Labs/Other Tests and Data Reviewed:    EKG: 12/08/2019 Sinus Rhythm with 1st degree AV block rate of 63  Recent Labs: 12/08/2019: BUN 25; Creatinine, Ser 1.41; Hemoglobin 14.2; Platelets 226; Potassium 3.9; Sodium 141   Recent Lipid Panel No results found for: CHOL, TRIG, HDL, CHOLHDL, LDLCALC, LDLDIRECT  Wt Readings from Last 3 Encounters:  12/14/19 223 lb 9.6 oz (101.4 kg)  12/08/19 220 lb (99.8 kg)  12/02/19 227 lb (103 kg)     Objective:    Vital Signs:  BP (!) 154/94    Pulse 80    Ht 5\' 11"  (1.803 m)    Wt 223 lb 9.6 oz (101.4 kg)    SpO2 94%    BMI 31.19 kg/m    ASSESSMENT & PLAN:    1. Accelerating angina [facility]) Last visit with Dr. Domenic Polite patient had been experiencing accelerating angina.  Dr. Domenic Polite scheduled cardiac catheterization.  Patient today denies any anginal or exertional symptoms.  2. Essential hypertension Blood pressure elevated on arrival at 154/94.  Subsequent recheck was 140/90.  Patient states his blood pressure at home is usually a little better but tends to run systolic in the range of 0000000 to 150s.  Advised patient to continue to monitor and if sustained blood pressures above 130/80 we will need to titrate one of his antihypertensive medications.  Continue amlodipine 2.5 mg p.o. twice daily, bisoprolol 5 mg daily, HCTZ 12.5 mg daily  3. CAD in native artery Cardiac catheterization 12/08/2019 showed proximal LAD lesion 55% stenosed with 60% stenosed sidebranch and first diagonal both lesions were tested with RPL/DFR and found to be not physiologically significant.  Distal LAD lesion 60% stenosed.  No interventions were  performed.  Continue aspirin 81 mg daily, Imdur 30 mg daily.  Start Lipitor 10 mg p.o. daily.  4. Snoring,  Apnea. Hypersomnolence Patient's wife states he has significant issues with snoring, periods of apnea daytime hypersomnolence.  He needs a sleep study.  He states his primary care provider had mentioned he would refer to a local neurologist who performed sleep studies.  Patient states he is going to have his PCP refer him to the neurologist so he can be evaluated for obstructive sleep apnea.   COVID-19 Education: The signs and symptoms of COVID-19 were discussed  with the patient and how to seek care for testing (follow up with PCP or arrange E-visit). The importance of social distancing was discussed today.  Medication Adjustments/Labs and Tests Ordered: Current medicines are reviewed at length with the patient today.  Concerns regarding medicines are outlined above.   Tests Ordered: No orders of the defined types were placed in this encounter.   Medication Changes: No orders of the defined types were placed in this encounter.   Follow Up: Dr Domenic Polite previously scheduled visit in June  Signed, Verta Ellen, NP  12/14/2019 3:54 PM    [place]

## 2019-12-14 NOTE — Patient Instructions (Addendum)
Medication Instructions:   Your physician recommends that you continue on your current medications as directed. Please refer to the Current Medication list given to you today.  Labwork:  NONE  Testing/Procedures:  NONE  Follow-Up:  Your physician recommends that you schedule a follow-up appointment in: as planned.  Any Other Special Instructions Will Be Listed Below (If Applicable).  If you need a refill on your cardiac medications before your next appointment, please call your pharmacy. 

## 2019-12-15 ENCOUNTER — Encounter: Payer: Self-pay | Admitting: Family Medicine

## 2019-12-15 ENCOUNTER — Telehealth: Payer: Self-pay | Admitting: *Deleted

## 2019-12-15 DIAGNOSIS — Z79899 Other long term (current) drug therapy: Secondary | ICD-10-CM

## 2019-12-15 DIAGNOSIS — E782 Mixed hyperlipidemia: Secondary | ICD-10-CM

## 2019-12-15 MED ORDER — ATORVASTATIN CALCIUM 10 MG PO TABS
10.0000 mg | ORAL_TABLET | Freq: Every day | ORAL | 3 refills | Status: DC
Start: 2019-12-15 — End: 2020-12-17

## 2019-12-15 NOTE — Telephone Encounter (Signed)
Per Andy--We need to start him on a statin medication Please start lipitor 10 mg to begin and get an FLP and LFT around 6 - 8 weeks after starting

## 2019-12-15 NOTE — Telephone Encounter (Signed)
Patient informed and verbalized understanding of plan. Will pick up lab orders at upcoming visit on 01/10/20

## 2019-12-27 ENCOUNTER — Ambulatory Visit: Payer: Medicare Other | Admitting: Cardiology

## 2020-01-10 ENCOUNTER — Other Ambulatory Visit: Payer: Self-pay

## 2020-01-10 ENCOUNTER — Ambulatory Visit (INDEPENDENT_AMBULATORY_CARE_PROVIDER_SITE_OTHER): Payer: Medicare Other | Admitting: Cardiology

## 2020-01-10 ENCOUNTER — Encounter: Payer: Self-pay | Admitting: Cardiology

## 2020-01-10 VITALS — BP 112/58 | HR 67 | Ht 71.0 in | Wt 224.0 lb

## 2020-01-10 DIAGNOSIS — I200 Unstable angina: Secondary | ICD-10-CM | POA: Diagnosis not present

## 2020-01-10 DIAGNOSIS — R0600 Dyspnea, unspecified: Secondary | ICD-10-CM

## 2020-01-10 DIAGNOSIS — I25119 Atherosclerotic heart disease of native coronary artery with unspecified angina pectoris: Secondary | ICD-10-CM

## 2020-01-10 DIAGNOSIS — R0609 Other forms of dyspnea: Secondary | ICD-10-CM

## 2020-01-10 DIAGNOSIS — I493 Ventricular premature depolarization: Secondary | ICD-10-CM | POA: Diagnosis not present

## 2020-01-10 MED ORDER — HYDROCHLOROTHIAZIDE 25 MG PO TABS
25.0000 mg | ORAL_TABLET | Freq: Every day | ORAL | 3 refills | Status: DC
Start: 2020-01-10 — End: 2021-01-14

## 2020-01-10 NOTE — Patient Instructions (Addendum)
Medication Instructions:   Your physician has recommended you make the following change in your medication:   Increase hydrochlorothiazide to 25 mg by mouth daily. You may take (2) of your 12.5 mg capsules daily until they are finished.  Continue other medications the same  Labwork:  NONE  Testing/Procedures:  NONE  Follow-Up:  Your physician recommends that you schedule a follow-up appointment in: 6 months (office).  Any Other Special Instructions Will Be Listed Below (If Applicable).  If you need a refill on your cardiac medications before your next appointment, please call your pharmacy.

## 2020-01-10 NOTE — Progress Notes (Signed)
Cardiology Office Note  Date: 01/10/2020   ID: Mitchell [name], DOB 1933-11-05, MRN [id number]  PCP:  Mitchell Hong, MD  Cardiologist:  Mitchell Lesches, MD Electrophysiologist:  None   Chief Complaint  Patient presents with  . Cardiac follow-up    History of Present Illness: Mitchell [name] is an 86 y.o. male last assessed via telehealth encounter by Mr. Mitchell Sake NP in May.  He is here with his wife for a follow-up visit.  Overall, he states that he has been doing reasonably well.  No exertional chest pain or progressive breathlessness, generally lack of energy as before.  Still functional with ADLs.  We did go over his medications.  HCTZ had been cut back significantly from prior dose, we discussed changing from 12.5 mg daily to 25 mg daily.  He has had some ankle edema.  I reviewed his cardiac catheterization results from April as outlined below.  Plan is to continue medical therapy and observation.  Past Medical History:  Diagnosis Date  . Allergic rhinitis   . CAD (coronary artery disease)    Moderate, nonobstructive disease  . Dupuytren's contracture    Right hand  . Essential hypertension   . GERD (gastroesophageal reflux disease)   . Gout   . Herpes zoster   . Hyperlipidemia   . Inguinal hernia    Right sided  . Internal hemorrhoids   . Lumbosacral spondylosis   . Prostate cancer [facility])     Past Surgical History:  Procedure Laterality Date  . HERNIA REPAIR    . INTRAVASCULAR PRESSURE WIRE/FFR STUDY N/A 12/08/2019   Procedure: INTRAVASCULAR PRESSURE WIRE/FFR STUDY;  Surgeon: Mitchell Man, MD;  Location: [place] [facility];  Service: Cardiovascular;  Laterality: N/A;  . LEFT HEART CATH AND CORONARY ANGIOGRAPHY N/A 12/08/2019   Procedure: LEFT HEART CATH AND CORONARY ANGIOGRAPHY;  Surgeon: Mitchell Man, MD;  Location: [place] [facility];  Service: Cardiovascular;  Laterality: N/A;  . TYMPANIC MEMBRANE REPAIR    . VASECTOMY      Current Outpatient Medications    Medication Sig Dispense Refill  . albuterol (VENTOLIN HFA) 108 (90 Base) MCG/ACT inhaler Inhale into the lungs every 6 (six) hours as needed for wheezing or shortness of breath.    . allopurinol (ZYLOPRIM) 100 MG tablet Take 200 mg by mouth daily.     Marland Kitchen amLODipine (NORVASC) 2.5 MG tablet Take 2.5 mg by mouth daily.    Marland Kitchen aspirin EC 81 MG tablet Take 81 mg by mouth daily.    Marland Kitchen atorvastatin (LIPITOR) 10 MG tablet Take 1 tablet (10 mg total) by mouth daily. 90 tablet 3  . bisoprolol (ZEBETA) 5 MG tablet Take 0.5 tablets (2.5 mg total) by mouth daily. 15 tablet 2  . colchicine 0.6 MG tablet Take 0.6 mg by mouth daily as needed (gout).     Marland Kitchen gabapentin (NEURONTIN) 300 MG capsule Take 600 mg by mouth 3 (three) times daily.     . indomethacin (INDOCIN) 25 MG capsule Take 25 mg by mouth 2 (two) times daily with a meal.    . isosorbide mononitrate (IMDUR) 30 MG 24 hr tablet Take 0.5 tablets (15 mg total) by mouth daily. 45 tablet 1  . leuprolide, 6 Month, (ELIGARD) 45 MG injection Inject 45 mg into the skin every 6 (six) months.    . meclizine (ANTIVERT) 25 MG tablet Take 25 mg by mouth 3 (three) times daily as needed for dizziness.    . Multiple  Vitamins-Minerals (MULTIVITAMIN WITH MINERALS) tablet Take 1 tablet by mouth daily.    . predniSONE (DELTASONE) 5 MG tablet Take 7.5 mg by mouth daily with breakfast.     . Propylene Glycol (SYSTANE BALANCE) 0.6 % SOLN Place 1 drop into both eyes daily as needed (dry eyes).    . hydrochlorothiazide (HYDRODIURIL) 25 MG tablet Take 1 tablet (25 mg total) by mouth daily. 90 tablet 3   Current Facility-Administered Medications  Medication Dose Route Frequency Provider Last Rate Last Admin  . sodium chloride flush (NS) 0.9 % injection 3 mL  3 mL Intravenous Q12H Mitchell Sark, MD       Allergies:  Lisinopril, Losartan, and Penicillins   ROS:   No palpitations or syncope.  Physical Exam: VS:  BP (!) 112/58   Pulse 67   Ht 5\' 11"  (1.803 m)   Wt 224 lb  (101.6 kg)   SpO2 98%   BMI 31.24 kg/m , BMI Body mass index is 31.24 kg/m.  Wt Readings from Last 3 Encounters:  01/10/20 224 lb (101.6 kg)  12/14/19 223 lb 9.6 oz (101.4 kg)  12/08/19 220 lb (99.8 kg)    General: Elderly male, appears comfortable at rest. HEENT: Conjunctiva and lids normal, wearing a mask. Neck: Supple, no elevated JVP or carotid bruits, no thyromegaly. Lungs: Clear to auscultation, nonlabored breathing at rest. Cardiac: Regular rate and rhythm, no S3, soft systolic murmur, no pericardial rub. Extremities: Mild ankle edema, distal pulses 2+.  ECG:  An ECG dated 12/08/2019 was personally reviewed today and demonstrated:  Sinus rhythm with prolonged PR interval.  Recent Labwork: 12/08/2019: BUN 25; Creatinine, Ser 1.41; Hemoglobin 14.2; Platelets 226; Potassium 3.9; Sodium 141   Other Studies Reviewed Today:  Cardiac catheterization 12/08/2019:  The left ventricular systolic function is normal.  The left ventricular ejection fraction is 50-55% by visual estimate.  LV end diastolic pressure is moderately elevated.  There is no aortic valve stenosis.  Prox LAD lesion is 55% stenosed with 60% stenosed side branch in 1st Diag. Both lesions were tested with RFR/DFR and found to be not physiologically significant.  Dist LAD lesion is 60% stenosed.    Angiographically moderate-severe single-vessel disease with ostial LAD eccentrically calcified 55-60% stenosis at takeoff of high branching 1st Diag -> RFR/DFR negative for both LAD and Diag; mid LAD focal 60% and hinge point  Otherwise minimal CAD in a right dominant system.  Normal LVEF and mild-moderately elevated EDP.  Angiographic imaging correlates with noninvasive nuclear stress test imaging. ->  No obvious culprit lesion.  Despite having lesions in the LAD, there was no evidence of anterior ischemia on the Myoview, this correlates with DFR/FFR results.  Echocardiogram 11/11/2019: 1. Abnormal septal motion  Inferior basal hypokinesis . Left ventricular  ejection fraction, by estimation, is 50 to 55%. The left ventricle has low  normal function. The left ventricle has no regional wall motion  abnormalities. Left ventricular diastolic  parameters are consistent with Grade I diastolic dysfunction (impaired  relaxation).  2. Right ventricular systolic function is normal. The right ventricular  size is normal.  3. Left atrial size was mildly dilated.  4. The mitral valve is normal in structure. No evidence of mitral valve  regurgitation. No evidence of mitral stenosis.  5. The aortic valve is tricuspid. Aortic valve regurgitation is trivial.  Mild to moderate aortic valve sclerosis/calcification is present, without  any evidence of aortic stenosis.  6. The inferior vena cava is normal in size with  greater than 50%  respiratory variability, suggesting right atrial pressure of 3 mmHg.   Assessment and Plan:  1.  CAD, moderate LAD disease by cardiac catheterization in April not hemodynamically significant by RFR/DFR with plan to continue medical therapy.  Continue aspirin, Norvasc, Lipitor, bisoprolol, and Imdur.  2.  Essential hypertension, blood pressure well controlled today.  No changes made to current regimen other than HCTZ to 25 mg daily.  3.  Frequent PVCs, no syncope.  LVEF low normal range.  Continue bisoprolol.  Medication Adjustments/Labs and Tests Ordered: Current medicines are reviewed at length with the patient today.  Concerns regarding medicines are outlined above.   Tests Ordered: No orders of the defined types were placed in this encounter.   Medication Changes: Meds ordered this encounter  Medications  . hydrochlorothiazide (HYDRODIURIL) 25 MG tablet    Sig: Take 1 tablet (25 mg total) by mouth daily.    Dispense:  90 tablet    Refill:  3    01/10/2020 dose increase    Disposition:  Follow up 6 months in the [place] office.  Signed, Mitchell Sark, MD,  [facility] 01/10/2020 11:24 AM    [facility], [place], [place] [address] Phone: [phone]; Fax: [phone]

## 2020-01-30 ENCOUNTER — Other Ambulatory Visit: Payer: Self-pay | Admitting: Cardiology

## 2020-01-30 MED ORDER — BISOPROLOL FUMARATE 5 MG PO TABS: 2.5000 mg | ORAL_TABLET | Freq: Every day | ORAL | 1 refills | Status: DC

## 2020-01-30 NOTE — Telephone Encounter (Signed)
Medication sent to pharmacy  

## 2020-01-30 NOTE — Telephone Encounter (Signed)
     1. Which medications need to be refilled? (please list name of each medication and dose if known)  bisoprolol (ZEBETA) 5 MG tablet    2. Which pharmacy/location (including street and city if local pharmacy) is medication to be sent to? [facility]  [address], [place]   3. Do they need a 30 day or 90 day supply? [place]

## 2020-02-14 ENCOUNTER — Telehealth: Payer: Self-pay | Admitting: Cardiology

## 2020-02-14 NOTE — Telephone Encounter (Signed)
Noted.  We could try to have him hold Imdur to see if this leads to any improvement.

## 2020-02-14 NOTE — Telephone Encounter (Signed)
Pt voiced understanding - will update [place] next week on symptoms

## 2020-02-14 NOTE — Telephone Encounter (Signed)
New message    Pt c/o medication issue:  1. Name of Medication:  bisoprolol (ZEBETA) 5 MG tablet Take 0.5 tablets (2.5 mg total) by mouth daily   isosorbide mononitrate (IMDUR) 30 MG 24 hr tablet Take 0.5 tablets (15 mg total) by mouth daily.     2. How are you currently taking this medication (dosage and times per day)? As prescribed  3. Are you having a reaction (difficulty breathing--STAT)? yes  4. What is your medication issue? Patient having severed dizziness all the time with these medications can he be switched to something else ?

## 2020-02-14 NOTE — Telephone Encounter (Signed)
Pt c/o dizziness since starting Isosorbide and bisoprolol that has gradually gotten worse over the last few days with mornings being the worst - says BP/HR WNL - denies swelling/weight gain - has SOB but says this is not new or worse

## 2020-03-01 ENCOUNTER — Telehealth: Payer: Self-pay | Admitting: Cardiology

## 2020-03-01 NOTE — Telephone Encounter (Signed)
[facility] Was told that they were out of a medication. States that [facility] could not tell the patient which medication they were out of.

## 2020-03-01 NOTE — Telephone Encounter (Signed)
Pt says he is not been taking bisoprolol and only taking Imdur 15 mg daily - says he has not been dizzy since stopped bisoprolol - [facility] who confirmed that bisoprolol was only medication they were out of but would have restocked tomorrow - pt says [facility] will [facility] about getting his Imdur refilled as this was the medication that he needed

## 2020-03-06 ENCOUNTER — Telehealth: Payer: Self-pay | Admitting: Cardiology

## 2020-03-06 NOTE — Telephone Encounter (Signed)
Medications reviewed with pt. He is currently taking Aspirin 81 mg, Hydrochlorothiazide 25 mg. Pt stopped taking bisoprolol on today and Imdur one week ago.

## 2020-03-06 NOTE — Telephone Encounter (Signed)
Pt notified and voiced that he will call and update [place] on his symptoms later this week.

## 2020-03-06 NOTE — Telephone Encounter (Signed)
Thank you for clarifying that.  Let's see if he feels better off the bisoprolol.  He is on very limited therapy from a cardiac perspective.  We could consider switching to a more potent diuretic if his swelling does not get better.

## 2020-03-06 NOTE — Telephone Encounter (Signed)
Please clarify all of his current cardiac medications and doses. I do not see bisoprolol on his current list.

## 2020-03-06 NOTE — Telephone Encounter (Signed)
Patient called stating that he continues to stay dizzy.  Thinks the medication is causing this.

## 2020-03-06 NOTE — Telephone Encounter (Signed)
Thank you for the update.  Are there any recent vital signs (BP, HR, weight) that I can review that might help determine if we need to make any medication adjustments?

## 2020-03-06 NOTE — Telephone Encounter (Signed)
Pt BP is 125/80 HR 85 weight 218 lbs on today.

## 2020-03-06 NOTE — Telephone Encounter (Signed)
Spoke with pt who states that he does not have any energy and is still dizzy. Pt c/o swelling to lower extremities at the end of the day. Denies SOB and chest pain at this time. Pt reports that he is still taking Bisoprolol. He is not taking Imdur at this time.  Please advise.

## 2020-05-08 DIAGNOSIS — C4442 Squamous cell carcinoma of skin of scalp and neck: Secondary | ICD-10-CM | POA: Insufficient documentation

## 2020-05-26 ENCOUNTER — Other Ambulatory Visit: Payer: Self-pay | Admitting: Cardiology

## 2020-07-16 ENCOUNTER — Ambulatory Visit (INDEPENDENT_AMBULATORY_CARE_PROVIDER_SITE_OTHER): Payer: Medicare Other | Admitting: Cardiology

## 2020-07-16 ENCOUNTER — Encounter: Payer: Self-pay | Admitting: Cardiology

## 2020-07-16 VITALS — BP 120/88 | HR 80 | Ht 71.0 in | Wt 222.8 lb

## 2020-07-16 DIAGNOSIS — I25119 Atherosclerotic heart disease of native coronary artery with unspecified angina pectoris: Secondary | ICD-10-CM

## 2020-07-16 DIAGNOSIS — I493 Ventricular premature depolarization: Secondary | ICD-10-CM

## 2020-07-16 DIAGNOSIS — I200 Unstable angina: Secondary | ICD-10-CM

## 2020-07-16 MED ORDER — BISOPROLOL FUMARATE 5 MG PO TABS: 5.0000 mg | ORAL_TABLET | Freq: Every day | ORAL | 6 refills | Status: DC

## 2020-07-16 MED ORDER — BISOPROLOL FUMARATE 5 MG PO TABS: 2.5000 mg | ORAL_TABLET | Freq: Every day | ORAL | 6 refills | Status: DC

## 2020-07-16 NOTE — Progress Notes (Signed)
Cardiology Office Note  Date: 07/16/2020   ID: Mitchell [name], DOB March 28, 1934, MRN [id number]  PCP:  Mitchell Hong, MD  Cardiologist:  Mitchell Lesches, MD Electrophysiologist:  None   Chief Complaint  Patient presents with  . Cardiac follow-up    History of Present Illness: Mitchell [name] is an 86 y.o. male last seen in June.  He is here today with his wife for a follow-up visit.  Generally feels somewhat better.  He states that he underwent ENT evaluation and ultimately was referred for physical therapy.  Sounds like he has had a combination of leg muscle weakness and also a component of benign positional vertigo.  He did discontinue bisoprolol since our last visit due to dizziness, but is willing to give it a try again in light of the above situation.  He is also no longer on Imdur.  He does not report any active angina, no sense of palpitations.  Does use a cane occasionally..  Past Medical History:  Diagnosis Date  . Allergic rhinitis   . CAD (coronary artery disease)    Moderate, nonobstructive disease  . Dupuytren's contracture    Right hand  . Essential hypertension   . GERD (gastroesophageal reflux disease)   . Gout   . Herpes zoster   . Hyperlipidemia   . Inguinal hernia    Right sided  . Internal hemorrhoids   . Lumbosacral spondylosis   . Prostate cancer [facility])     Past Surgical History:  Procedure Laterality Date  . HERNIA REPAIR    . INTRAVASCULAR PRESSURE WIRE/FFR STUDY N/A 12/08/2019   Procedure: INTRAVASCULAR PRESSURE WIRE/FFR STUDY;  Surgeon: Leonie Man, MD;  Location: [place] [facility];  Service: Cardiovascular;  Laterality: N/A;  . LEFT HEART CATH AND CORONARY ANGIOGRAPHY N/A 12/08/2019   Procedure: LEFT HEART CATH AND CORONARY ANGIOGRAPHY;  Surgeon: Leonie Man, MD;  Location: [place] [facility];  Service: Cardiovascular;  Laterality: N/A;  . TYMPANIC MEMBRANE REPAIR    . VASECTOMY      Current Outpatient Medications  Medication Sig  Dispense Refill  . albuterol (VENTOLIN HFA) 108 (90 Base) MCG/ACT inhaler Inhale into the lungs every 6 (six) hours as needed for wheezing or shortness of breath.    . allopurinol (ZYLOPRIM) 100 MG tablet Take 200 mg by mouth daily.     Marland Kitchen amLODipine (NORVASC) 2.5 MG tablet Take 2.5 mg by mouth daily.    Marland Kitchen aspirin EC 81 MG tablet Take 81 mg by mouth daily.    Marland Kitchen atorvastatin (LIPITOR) 10 MG tablet Take 1 tablet (10 mg total) by mouth daily. 90 tablet 3  . colchicine 0.6 MG tablet Take 0.6 mg by mouth daily as needed (gout).     Marland Kitchen gabapentin (NEURONTIN) 300 MG capsule Take 600 mg by mouth 3 (three) times daily.     . hydrochlorothiazide (HYDRODIURIL) 25 MG tablet Take 1 tablet (25 mg total) by mouth daily. 90 tablet 3  . indomethacin (INDOCIN) 25 MG capsule Take 25 mg by mouth 2 (two) times daily with a meal.    . leuprolide, 6 Month, (ELIGARD) 45 MG injection Inject 45 mg into the skin every 6 (six) months.    . meclizine (ANTIVERT) 25 MG tablet Take 25 mg by mouth 3 (three) times daily as needed for dizziness.    . Multiple Vitamins-Minerals (MULTIVITAMIN WITH MINERALS) tablet Take 1 tablet by mouth daily.    . predniSONE (DELTASONE) 5 MG tablet Take 7.5  mg by mouth daily with breakfast.     . Propylene Glycol (SYSTANE BALANCE) 0.6 % SOLN Place 1 drop into both eyes daily as needed (dry eyes).    . bisoprolol (ZEBETA) 5 MG tablet Take 0.5 tablets (2.5 mg total) by mouth daily. 15 tablet 6   Current Facility-Administered Medications  Medication Dose Route Frequency Provider Last Rate Last Admin  . sodium chloride flush (NS) 0.9 % injection 3 mL  3 mL Intravenous Q12H Satira Sark, MD       Allergies:  Lisinopril, Losartan, and Penicillins   ROS: No falls.  Physical Exam: VS:  BP 120/88   Pulse 80   Ht 5\' 11"  (1.803 m)   Wt 222 lb 12.8 oz (101.1 kg)   SpO2 96%   BMI 31.07 kg/m , BMI Body mass index is 31.07 kg/m.  Wt Readings from Last 3 Encounters:  07/16/20 222 lb 12.8 oz  (101.1 kg)  01/10/20 224 lb (101.6 kg)  12/14/19 223 lb 9.6 oz (101.4 kg)    General: Elderly male, appears comfortable at rest. HEENT: Conjunctiva and lids normal, wearing a mask. Neck: Supple, no elevated JVP or carotid bruits, no thyromegaly. Lungs: Clear to auscultation, nonlabored breathing at rest. Cardiac: Regular rate and rhythm with ectopy, no S3, soft systolic murmur. Extremities: No pitting edema.  ECG:  An ECG dated 12/08/2019 was personally reviewed today and demonstrated:  Sinus rhythm with prolonged PR interval.  Recent Labwork: 12/08/2019: BUN 25; Creatinine, Ser 1.41; Hemoglobin 14.2; Platelets 226; Potassium 3.9; Sodium 141   Other Studies Reviewed Today:  Cardiac catheterization 12/08/2019:  The left ventricular systolic function is normal.  The left ventricular ejection fraction is 50-55% by visual estimate.  LV end diastolic pressure is moderately elevated.  There is no aortic valve stenosis.  Prox LAD lesion is 55% stenosed with 60% stenosed side branch in 1st Diag. Both lesions were tested with RFR/DFR and found to be not physiologically significant.  Dist LAD lesion is 60% stenosed.   Angiographically moderate-severe single-vessel disease with ostial LAD eccentrically calcified 55-60% stenosis at takeoff of high branching 1st Diag -> RFR/DFR negative for both LAD and Diag; mid LAD focal 60% and hinge point  Otherwise minimal CAD in a right dominant system.  Normal LVEF and mild-moderately elevated EDP.  Angiographic imaging correlates with noninvasive nuclear stress test imaging. ->No obvious culprit lesion. Despite having lesions in the LAD, there was no evidence of anterior ischemia on the Myoview, this correlates with DFR/FFR results.  Echocardiogram 11/11/2019: 1. Abnormal septal motion Inferior basal hypokinesis . Left ventricular  ejection fraction, by estimation, is 50 to 55%. The left ventricle has low  normal function. The left ventricle  has no regional wall motion  abnormalities. Left ventricular diastolic  parameters are consistent with Grade I diastolic dysfunction (impaired  relaxation).  2. Right ventricular systolic function is normal. The right ventricular  size is normal.  3. Left atrial size was mildly dilated.  4. The mitral valve is normal in structure. No evidence of mitral valve  regurgitation. No evidence of mitral stenosis.  5. The aortic valve is tricuspid. Aortic valve regurgitation is trivial.  Mild to moderate aortic valve sclerosis/calcification is present, without  any evidence of aortic stenosis.  6. The inferior vena cava is normal in size with greater than 50%  respiratory variability, suggesting right atrial pressure of 3 mmHg.   Assessment and Plan:  1.  Nonobstructive CAD, moderate in LAD distribution and managed medically without  active angina.  Continue aspirin, Norvasc, and Lipitor.  2.  Frequent PVCs.  Resuming low-dose bisoprolol.  LVEF normal range.  He has had no syncope.  Medication Adjustments/Labs and Tests Ordered: Current medicines are reviewed at length with the patient today.  Concerns regarding medicines are outlined above.   Tests Ordered: No orders of the defined types were placed in this encounter.   Medication Changes: Meds ordered this encounter  Medications  . DISCONTD: bisoprolol (ZEBETA) 5 MG tablet    Sig: Take 1 tablet (5 mg total) by mouth daily.    Dispense:  30 tablet    Refill:  6    07/16/2020 restart  . bisoprolol (ZEBETA) 5 MG tablet    Sig: Take 0.5 tablets (2.5 mg total) by mouth daily.    Dispense:  15 tablet    Refill:  6    07/16/2020 restart    Disposition:  Follow up 6 months in the [place] office.  Signed, Satira Sark, MD, [facility] 07/16/2020 11:47 AM    [facility] [facility], [place], [place] [address] Phone: [phone]; Fax: [phone]

## 2020-07-16 NOTE — Patient Instructions (Addendum)
Medication Instructions:   Your physician has recommended you make the following change in your medication:   Restart bisoprolol 2.5 mg by mouth daily  Continue other medications the same  Labwork:  None  Testing/Procedures:  None  Follow-Up:  Your physician recommends that you schedule a follow-up appointment in: 6 months.  Any Other Special Instructions Will Be Listed Below (If Applicable).  If you need a refill on your cardiac medications before your next appointment, please call your pharmacy.

## 2020-11-20 DIAGNOSIS — G8929 Other chronic pain: Secondary | ICD-10-CM | POA: Insufficient documentation

## 2020-11-22 DIAGNOSIS — M5441 Lumbago with sciatica, right side: Secondary | ICD-10-CM | POA: Insufficient documentation

## 2020-11-22 DIAGNOSIS — M4316 Spondylolisthesis, lumbar region: Secondary | ICD-10-CM | POA: Insufficient documentation

## 2020-11-22 DIAGNOSIS — M5416 Radiculopathy, lumbar region: Secondary | ICD-10-CM | POA: Insufficient documentation

## 2020-11-22 DIAGNOSIS — G8929 Other chronic pain: Secondary | ICD-10-CM | POA: Insufficient documentation

## 2020-11-22 DIAGNOSIS — M5136 Other intervertebral disc degeneration, lumbar region: Secondary | ICD-10-CM | POA: Insufficient documentation

## 2020-11-22 DIAGNOSIS — M48061 Spinal stenosis, lumbar region without neurogenic claudication: Secondary | ICD-10-CM | POA: Insufficient documentation

## 2020-12-17 ENCOUNTER — Other Ambulatory Visit: Payer: Self-pay | Admitting: Family Medicine

## 2021-01-01 ENCOUNTER — Encounter (HOSPITAL_COMMUNITY): Payer: Self-pay

## 2021-01-01 ENCOUNTER — Other Ambulatory Visit: Payer: Self-pay

## 2021-01-01 ENCOUNTER — Emergency Department (HOSPITAL_COMMUNITY): Payer: Medicare Other

## 2021-01-01 ENCOUNTER — Emergency Department (HOSPITAL_COMMUNITY)
Admission: EM | Admit: 2021-01-01 | Discharge: 2021-01-01 | Disposition: A | Discharge status: Home / Self Care | Payer: Medicare Other | Attending: Emergency Medicine | Admitting: Emergency Medicine

## 2021-01-01 DIAGNOSIS — Z8546 Personal history of malignant neoplasm of prostate: Secondary | ICD-10-CM | POA: Diagnosis not present

## 2021-01-01 DIAGNOSIS — R072 Precordial pain: Secondary | ICD-10-CM | POA: Diagnosis present

## 2021-01-01 DIAGNOSIS — Z7982 Long term (current) use of aspirin: Secondary | ICD-10-CM | POA: Diagnosis not present

## 2021-01-01 DIAGNOSIS — Z955 Presence of coronary angioplasty implant and graft: Secondary | ICD-10-CM | POA: Diagnosis not present

## 2021-01-01 DIAGNOSIS — Z20822 Contact with and (suspected) exposure to covid-19: Secondary | ICD-10-CM | POA: Diagnosis not present

## 2021-01-01 DIAGNOSIS — R0602 Shortness of breath: Secondary | ICD-10-CM

## 2021-01-01 DIAGNOSIS — R600 Localized edema: Secondary | ICD-10-CM | POA: Diagnosis not present

## 2021-01-01 DIAGNOSIS — I10 Essential (primary) hypertension: Secondary | ICD-10-CM | POA: Diagnosis not present

## 2021-01-01 DIAGNOSIS — Z79899 Other long term (current) drug therapy: Secondary | ICD-10-CM | POA: Diagnosis not present

## 2021-01-01 DIAGNOSIS — I2510 Atherosclerotic heart disease of native coronary artery without angina pectoris: Secondary | ICD-10-CM | POA: Insufficient documentation

## 2021-01-01 DIAGNOSIS — R079 Chest pain, unspecified: Secondary | ICD-10-CM

## 2021-01-01 LAB — TROPONIN I (HIGH SENSITIVITY)
Troponin I (High Sensitivity): 10 ng/L (ref <18)
Troponin I (High Sensitivity): 9 ng/L (ref <18)

## 2021-01-01 LAB — BASIC METABOLIC PANEL
Anion gap: 10 (ref 5–15)
BUN: 28 mg/dL — ABNORMAL HIGH (ref 8–23)
CO2: 25 mmol/L (ref 22–32)
Calcium: 9.6 mg/dL (ref 8.9–10.3)
Chloride: 106 mmol/L (ref 98–111)
Creatinine, Ser: 1.72 mg/dL — ABNORMAL HIGH (ref 0.61–1.24)
GFR, Estimated: 39 mL/min — ABNORMAL LOW (ref >60)
Glucose, Bld: 90 mg/dL (ref 70–99)
Potassium: 4 mmol/L (ref 3.5–5.1)
Sodium: 141 mmol/L (ref 135–145)

## 2021-01-01 LAB — CBC
HCT: 42.8 % (ref 39.0–52.0)
Hemoglobin: 13.5 g/dL (ref 13.0–17.0)
MCH: 31.5 pg (ref 26.0–34.0)
MCHC: 31.5 g/dL (ref 30.0–36.0)
MCV: 99.8 fL (ref 80.0–100.0)
Platelets: 229 10*3/uL (ref 150–400)
RBC: 4.29 MIL/uL (ref 4.22–5.81)
RDW: 15 % (ref 11.5–15.5)
WBC: 6.4 10*3/uL (ref 4.0–10.5)
nRBC: 0 % (ref 0.0–0.2)

## 2021-01-01 LAB — RESP PANEL BY RT-PCR (FLU A&B, COVID) ARPGX2
Influenza A by PCR: NEGATIVE
Influenza B by PCR: NEGATIVE
SARS Coronavirus 2 by RT PCR: NEGATIVE

## 2021-01-01 LAB — D-DIMER, QUANTITATIVE: D-Dimer, Quant: 1.05 ug/mL-FEU — ABNORMAL HIGH (ref 0.00–0.50)

## 2021-01-01 MED ORDER — IOHEXOL 350 MG/ML SOLN
25.0000 mL | Freq: Once | INTRAVENOUS | Status: AC | PRN
  Administered 2021-01-01: 25 mL via INTRAVENOUS

## 2021-01-01 MED ORDER — IOHEXOL 350 MG/ML SOLN
25.0000 mL | Freq: Once | INTRAVENOUS | Status: AC | PRN
  Administered 2021-01-01: 10 mL via INTRAVENOUS

## 2021-01-01 NOTE — ED Triage Notes (Signed)
Pt reports woke up around 2 am with sob and sharp pain in left chest.  Reports chest pain was nonradiating.   Chest pain improved but still c/o sob.  Denies cough or fever.

## 2021-01-01 NOTE — ED Provider Notes (Signed)
Pt with no signs of MI or PE - stable for d/c Pt informed - agreeable to plan to f/u in outpt setting   Noemi Chapel, MD 01/01/21 1636

## 2021-01-01 NOTE — ED Provider Notes (Signed)
[facility] Provider Note   CSN: [id number] Arrival date & time: 01/01/21  1004     History Chief Complaint  Patient presents with  . Chest Pain    Mitchell [name] is a 87 y.o. male.  Patient woke up around 2 in the morning with shortness of breath and sharp pain in the chest left side of the chest.  This lasted on and off till about 6 in the morning.  Now has resolved.  Still has some of the shortness of breath feeling.  Denies any cough or fever.  Patient has been fully vaccinated against COVID.  Patient also reports for the past 2weeks significant increase in leg swelling equally bilaterally.  Has had some swelling around his ankles in the past.  Is followed by cardiology in [place].  For coronary artery disease.  But not known to have congestive heart failure.  Past medical history also significant for hypertension hyperlipidemia.        Past Medical History:  Diagnosis Date  . Allergic rhinitis   . CAD (coronary artery disease)    Moderate, nonobstructive disease  . Dupuytren's contracture    Right hand  . Essential hypertension   . GERD (gastroesophageal reflux disease)   . Gout   . Herpes zoster   . Hyperlipidemia   . Inguinal hernia    Right sided  . Internal hemorrhoids   . Lumbosacral spondylosis   . Prostate cancer [facility])     Patient Active Problem List   Diagnosis Date Noted  . Atypical angina ([place]) 12/08/2019  . DOE (dyspnea on exertion) 12/08/2019  . Accelerating angina [facility])     Past Surgical History:  Procedure Laterality Date  . HERNIA REPAIR    . INTRAVASCULAR PRESSURE WIRE/FFR STUDY N/A 12/08/2019   Procedure: INTRAVASCULAR PRESSURE WIRE/FFR STUDY;  Surgeon: Leonie Man, MD;  Location: [place] [facility];  Service: Cardiovascular;  Laterality: N/A;  . LEFT HEART CATH AND CORONARY ANGIOGRAPHY N/A 12/08/2019   Procedure: LEFT HEART CATH AND CORONARY ANGIOGRAPHY;  Surgeon: Leonie Man, MD;  Location: [place] [facility];   Service: Cardiovascular;  Laterality: N/A;  . TYMPANIC MEMBRANE REPAIR    . VASECTOMY         Family History  Problem Relation Age of Onset  . Heart failure Mother   . Heart failure Father   . CAD Father   . Cancer Sister   . CAD Brother   . Stroke Brother     Social History   Tobacco Use  . Smoking status: Never Smoker  . Smokeless tobacco: Never Used  Substance Use Topics  . Alcohol use: Not Currently  . Drug use: Never    Home Medications Prior to Admission medications   Medication Sig Start Date End Date Taking? Authorizing Provider  albuterol (VENTOLIN HFA) 108 (90 Base) MCG/ACT inhaler Inhale into the lungs every 6 (six) hours as needed for wheezing or shortness of breath.    [provider]  allopurinol (ZYLOPRIM) 100 MG tablet Take 200 mg by mouth daily.     [provider]  amLODipine (NORVASC) 2.5 MG tablet Take 2.5 mg by mouth daily.    [provider]  aspirin EC 81 MG tablet Take 81 mg by mouth daily.    [provider]  atorvastatin (LIPITOR) 10 MG tablet TAKE 1 TABLET BY MOUTH EVERY DAY 12/17/20   Verta Ellen., NP  bisoprolol (ZEBETA) 5 MG tablet Take 0.5 tablets (  2.5 mg total) by mouth daily. 07/16/20   Satira Sark, MD  colchicine 0.6 MG tablet Take 0.6 mg by mouth daily as needed (gout).     [provider]  gabapentin (NEURONTIN) 300 MG capsule Take 600 mg by mouth 3 (three) times daily.     [provider]  hydrochlorothiazide (HYDRODIURIL) 25 MG tablet Take 1 tablet (25 mg total) by mouth daily. 01/10/20 07/16/20  Satira Sark, MD  indomethacin (INDOCIN) 25 MG capsule Take 25 mg by mouth 2 (two) times daily with a meal.    [provider]  leuprolide, 6 Month, (ELIGARD) 45 MG injection Inject 45 mg into the skin every 6 (six) months.    [provider]  meclizine (ANTIVERT) 25 MG tablet Take 25 mg by mouth 3 (three) times daily as needed for dizziness.    [provider]  Multiple Vitamins-Minerals (MULTIVITAMIN WITH MINERALS) tablet Take 1 tablet by mouth daily.    [provider]  predniSONE (DELTASONE) 5 MG tablet Take 7.5 mg by mouth daily with breakfast.     [provider]  Propylene Glycol (SYSTANE BALANCE) 0.6 % SOLN Place 1 drop into both eyes daily as needed (dry eyes).    [provider]    Allergies    Lisinopril, Losartan, and Penicillins  Review of Systems   Review of Systems  Constitutional: Negative for chills and fever.  HENT: Negative for rhinorrhea and sore throat.   Eyes: Negative for visual disturbance.  Respiratory: Positive for shortness of breath. Negative for cough.   Cardiovascular: Positive for chest pain and leg swelling.  Gastrointestinal: Negative for abdominal pain, diarrhea, nausea and vomiting.  Genitourinary: Negative for dysuria.  Musculoskeletal: Negative for back pain and neck pain.  Skin: Negative for rash.  Neurological: Negative for dizziness, light-headedness and headaches.  Hematological: Does not bruise/bleed easily.  Psychiatric/Behavioral: Negative for confusion.    Physical Exam Updated Vital Signs BP (!) 154/105   Pulse 64   Temp 98.7 F (37.1 C) (Oral)   Resp 20   Ht 1.753 m (5\' 9" )   Wt 97.5 kg   SpO2 93%   BMI 31.75 kg/m   Physical Exam Vitals and nursing note reviewed.  Constitutional:      General: He is not in acute distress.    Appearance: Normal appearance. He is well-developed.  HENT:     Head: Normocephalic and atraumatic.  Eyes:     Extraocular Movements: Extraocular movements intact.     Conjunctiva/sclera: Conjunctivae normal.     Pupils: Pupils are equal, round, and reactive to light.  Cardiovascular:     Rate and Rhythm: Normal rate and regular rhythm.     Heart sounds: No murmur heard.   Pulmonary:     Effort: Pulmonary effort is normal. No respiratory distress.     Breath sounds: Normal breath sounds. No wheezing, rhonchi  or rales.  Abdominal:     Palpations: Abdomen is soft.     Tenderness: There is no abdominal tenderness.  Musculoskeletal:        General: Swelling present.     Cervical back: Neck supple.     Right lower leg: Edema present.     Left lower leg: Edema present.     Comments: Marked bilateral lower extremity edema up to the knees.  A skin tear to the right anterior shin measuring about 3 cm.  No signs of infection.  Skin:    General: Skin is warm  and dry.     Capillary Refill: Capillary refill takes 2 to 3 seconds.  Neurological:     General: No focal deficit present.     Mental Status: He is alert and oriented to person, place, and time.     ED Results / Procedures / Treatments   Labs (all labs ordered are listed, but only abnormal results are displayed) Labs Reviewed  BASIC METABOLIC PANEL - Abnormal; Notable for the following components:      Result Value   BUN 28 (*)    Creatinine, Ser 1.72 (*)    GFR, Estimated 39 (*)    All other components within normal limits  D-DIMER, QUANTITATIVE - Abnormal; Notable for the following components:   D-Dimer, Quant 1.05 (*)    All other components within normal limits  RESP PANEL BY RT-PCR (FLU A&B, COVID) ARPGX2  CBC  TROPONIN I (HIGH SENSITIVITY)  TROPONIN I (HIGH SENSITIVITY)    EKG EKG Interpretation  Date/Time:  Tuesday Jan 01 2021 10:18:23 EDT Ventricular Rate:  64 PR Interval:  208 QRS Duration: 88 QT Interval:  404 QTC Calculation: 416 R Axis:   -18 Text Interpretation: Normal sinus rhythm Minimal voltage criteria for LVH, may be normal variant ( R in aVL ) Possible Anterolateral infarct , age undetermined Abnormal ECG Confirmed by Fredia Sorrow [phone]) on 01/01/2021 10:57:56 AM   Radiology DG Chest Port 1 View  Result Date: 01/01/2021 CLINICAL DATA:  Chest pain EXAM: PORTABLE CHEST 1 VIEW COMPARISON:  None. FINDINGS: Normal heart size. Aortic tortuosity accentuated by rotation. Low volume chest with mild  interstitial coarsening. There is no edema, consolidation, effusion, or pneumothorax. IMPRESSION: Negative portable chest Electronically Signed   By: Monte Fantasia M.D.   On: 01/01/2021 11:07    Procedures Procedures   CRITICAL CARE Performed by: Fredia Sorrow Total critical care time: 35 minutes Critical care time was exclusive of separately billable procedures and treating other patients. Critical care was necessary to treat or prevent imminent or life-threatening deterioration. Critical care was time spent personally by me on the following activities: development of treatment plan with patient and/or surrogate as well as nursing, discussions with consultants, evaluation of patient's response to treatment, examination of patient, obtaining history from patient or surrogate, ordering and performing treatments and interventions, ordering and review of laboratory studies, ordering and review of radiographic studies, pulse oximetry and re-evaluation of patient's condition.   Medications Ordered in ED Medications  iohexol (OMNIPAQUE) 350 MG/ML injection 25 mL (25 mLs Intravenous Contrast Given 01/01/21 1538)  iohexol (OMNIPAQUE) 350 MG/ML injection 25 mL (25 mLs Intravenous Contrast Given 01/01/21 1538)  iohexol (OMNIPAQUE) 350 MG/ML injection 25 mL (10 mLs Intravenous Contrast Given 01/01/21 1537)    ED Course  I have reviewed the triage vital signs and the nursing notes.  Pertinent labs & imaging results that were available during my care of the patient were reviewed by me and considered in my medical decision making (see chart for details).    MDM Rules/Calculators/A&P                          Work-up here chest x-ray without any acute findings.  No evidence of any pulmonary edema.  No evidence of pneumonia.  COVID testing negative.  Troponins x2 without significant change.  D-dimer elevated though at 1.05 so even age-adjusted is elevated.  Clinically did have suspicion and concern for  possible PE since patient had such an acute  onset of the shortness of breath with chest pain chest pain resolved about 6 in the morning.  But has persisted with shortness of breath.  CT angio has been ordered.  Renal function is borderline.  May require reduce dye load. No leukocytosis no anemia.  If CT angio does not show pulmonary embolus patient's cleared for discharge home.  Would recommend following up with his cardiologist in [place].  Could have pure right-sided heart failure per tickly in the face of the marked leg swelling.  Doubt based on his GFR that it is related to renal insufficiency.       Final Clinical Impression(s) / ED Diagnoses Final diagnoses:  Precordial pain  SOB (shortness of breath)  Bilateral leg edema    Rx / DC Orders ED Discharge Orders    None       Fredia Sorrow, MD 01/01/21 1540

## 2021-01-01 NOTE — Discharge Instructions (Signed)
Follow-up with cardiology for the bilateral leg swelling.  Which could represent right-sided heart failure.  COVID testing influenza testing negative.  Work-up for the chest pain without any acute findings.

## 2021-01-02 ENCOUNTER — Telehealth: Payer: Self-pay | Admitting: Cardiology

## 2021-01-02 NOTE — Telephone Encounter (Signed)
Reports SOB about the same today as yesterday and is unchanged Reports active chest pain rated 6/10 that is unchanged from yesterday Reports swelling in BLE that is unchanged from yesterday Denies dizziness Advised that ED suggest he f/u with Domenic Polite and PCP. Has appointment with Domenic Polite on 01/14/21 which is soonest available appointment at our office. Has PCP appointment on 01/22/2021. Advised to contact PCP for a sooner appointment and return to ED if symptoms get worse Advised if symptoms got worse, to go to the ED for an evaluation Verbalized understanding of plan

## 2021-01-02 NOTE — Telephone Encounter (Signed)
Pt c/o Shortness Of Breath: STAT if SOB developed within the last 24 hours or pt is noticeably SOB on the phone  1. Are you currently SOB (can you hear that pt is SOB on the phone)? No   2. How long have you been experiencing SOB?  Approximately 2 days   3. Are you SOB when sitting or when up moving around? Yes both   4. Are you currently experiencing any other symptoms? No   States that he went to the ER [facility] 01/01/2021 .States that he is not better now than he was yesterday.  #   [phone]

## 2021-01-07 DIAGNOSIS — E669 Obesity, unspecified: Secondary | ICD-10-CM | POA: Insufficient documentation

## 2021-01-14 ENCOUNTER — Ambulatory Visit (INDEPENDENT_AMBULATORY_CARE_PROVIDER_SITE_OTHER): Payer: Medicare Other | Admitting: Cardiology

## 2021-01-14 ENCOUNTER — Encounter: Payer: Self-pay | Admitting: Cardiology

## 2021-01-14 VITALS — BP 130/62 | HR 76 | Ht 69.0 in | Wt 211.8 lb

## 2021-01-14 DIAGNOSIS — I493 Ventricular premature depolarization: Secondary | ICD-10-CM

## 2021-01-14 DIAGNOSIS — R0602 Shortness of breath: Secondary | ICD-10-CM

## 2021-01-14 DIAGNOSIS — I25119 Atherosclerotic heart disease of native coronary artery with unspecified angina pectoris: Secondary | ICD-10-CM

## 2021-01-14 NOTE — Progress Notes (Signed)
Cardiology Office Note  Date: 01/14/2021   ID: Mitchell [name], DOB 04/27/1934, MRN [id number]  PCP:  Eber Hong, MD  Cardiologist:  Rozann Lesches, MD Electrophysiologist:  None   Chief Complaint  Patient presents with  . Cardiac follow-up    History of Present Illness: Mitchell [name] is an 87 y.o. male last seen in December 2021.  He is here for a follow-up visit with his wife.  He was seen in the ER in late May for evaluation of shortness of breath and chest discomfort.  ECG showed no acute ST segment changes at that time.  Chest CTA was negative for pulmonary embolus as well as aortic dissection.  Incidentally did have enlarged anterior mediastinal lymph nodes.  Troponin I levels were normal.  SARS coronavirus 2 test was negative.  He states that symptoms began fairly abruptly, he was short of breath when he got up 1 morning, worse as the day went on resulting in ER visit above.  He still has symptoms although to a lesser degree.  No cough, possibly some intermittent wheezing.  No increasing weight or progressive leg swelling.  He is limited by chronic back pain and significant peripheral neuropathy, using a walker and prone to falls.  Past Medical History:  Diagnosis Date  . Allergic rhinitis   . CAD (coronary artery disease)    Moderate, nonobstructive disease  . Dupuytren's contracture    Right hand  . Essential hypertension   . GERD (gastroesophageal reflux disease)   . Gout   . Herpes zoster   . Hyperlipidemia   . Inguinal hernia    Right sided  . Internal hemorrhoids   . Lumbosacral spondylosis   . Prostate cancer [facility])     Past Surgical History:  Procedure Laterality Date  . HERNIA REPAIR    . INTRAVASCULAR PRESSURE WIRE/FFR STUDY N/A 12/08/2019   Procedure: INTRAVASCULAR PRESSURE WIRE/FFR STUDY;  Surgeon: Leonie Man, MD;  Location: [place] [facility];  Service: Cardiovascular;  Laterality: N/A;  . LEFT HEART CATH AND CORONARY ANGIOGRAPHY N/A 12/08/2019    Procedure: LEFT HEART CATH AND CORONARY ANGIOGRAPHY;  Surgeon: Leonie Man, MD;  Location: [place] [facility];  Service: Cardiovascular;  Laterality: N/A;  . TYMPANIC MEMBRANE REPAIR    . VASECTOMY      Current Outpatient Medications  Medication Sig Dispense Refill  . albuterol (VENTOLIN HFA) 108 (90 Base) MCG/ACT inhaler Inhale into the lungs every 6 (six) hours as needed for wheezing or shortness of breath.    . allopurinol (ZYLOPRIM) 100 MG tablet Take 200 mg by mouth daily.     Marland Kitchen amLODipine (NORVASC) 2.5 MG tablet Take 2.5 mg by mouth daily.    Marland Kitchen aspirin EC 81 MG tablet Take 81 mg by mouth daily.    Marland Kitchen atorvastatin (LIPITOR) 10 MG tablet TAKE 1 TABLET BY MOUTH EVERY DAY 90 tablet 1  . bisoprolol (ZEBETA) 5 MG tablet Take 5 mg by mouth daily.    . colchicine 0.6 MG tablet Take 0.6 mg by mouth daily as needed (gout).     Marland Kitchen gabapentin (NEURONTIN) 300 MG capsule Take 600 mg by mouth 3 (three) times daily.     . hydrochlorothiazide (HYDRODIURIL) 25 MG tablet Take 25 mg by mouth daily.    . indomethacin (INDOCIN) 25 MG capsule Take 25 mg by mouth 2 (two) times daily with a meal.    . leuprolide, 6 Month, (ELIGARD) 45 MG injection Inject 45 mg into  the skin every 6 (six) months.    . meclizine (ANTIVERT) 25 MG tablet Take 25 mg by mouth 3 (three) times daily as needed for dizziness.    . Multiple Vitamins-Minerals (MULTIVITAMIN WITH MINERALS) tablet Take 1 tablet by mouth daily.    . predniSONE (DELTASONE) 5 MG tablet Take 7.5 mg by mouth daily with breakfast.     . Propylene Glycol (SYSTANE BALANCE) 0.6 % SOLN Place 1 drop into both eyes daily as needed (dry eyes).     Current Facility-Administered Medications  Medication Dose Route Frequency Provider Last Rate Last Admin  . sodium chloride flush (NS) 0.9 % injection 3 mL  3 mL Intravenous Q12H Satira Sark, MD       Allergies:  Lisinopril, Losartan, and Penicillins   ROS: Wearing a mask.  Physical Exam: VS:  BP 130/62    Pulse 76   Ht 5\' 9"  (1.753 m)   Wt 211 lb 12.8 oz (96.1 kg)   SpO2 94%   BMI 31.28 kg/m , BMI Body mass index is 31.28 kg/m.  Wt Readings from Last 3 Encounters:  01/14/21 211 lb 12.8 oz (96.1 kg)  01/01/21 215 lb (97.5 kg)  07/16/20 222 lb 12.8 oz (101.1 kg)    General: Patient appears comfortable at rest. HEENT: Conjunctiva and lids normal, wearing a mask. Neck: Supple, no elevated JVP or carotid bruits, no thyromegaly. Lungs: Clear to auscultation, nonlabored breathing at rest. Cardiac: Regular rate and rhythm, no S3, 1/6 systolic murmur, no pericardial rub. Extremities: Mild ankle edema.  ECG:  An ECG dated 01/01/2021 was personally reviewed today and demonstrated:  Sinus rhythm with increased voltage and nonspecific T wave changes.  Recent Labwork: 01/01/2021: BUN 28; Creatinine, Ser 1.72; Hemoglobin 13.5; Platelets 229; Potassium 4.0; Sodium 141   Other Studies Reviewed Today:  Cardiac catheterization 12/08/2019:  The left ventricular systolic function is normal.  The left ventricular ejection fraction is 50-55% by visual estimate.  LV end diastolic pressure is moderately elevated.  There is no aortic valve stenosis.  Prox LAD lesion is 55% stenosed with 60% stenosed side branch in 1st Diag. Both lesions were tested with RFR/DFR and found to be not physiologically significant.  Dist LAD lesion is 60% stenosed.   Angiographically moderate-severe single-vessel disease with ostial LAD eccentrically calcified 55-60% stenosis at takeoff of high branching 1st Diag -> RFR/DFR negative for both LAD and Diag; mid LAD focal 60% and hinge point  Otherwise minimal CAD in a right dominant system.  Normal LVEF and mild-moderately elevated EDP.  Angiographic imaging correlates with noninvasive nuclear stress test imaging. ->No obvious culprit lesion. Despite having lesions in the LAD, there was no evidence of anterior ischemia on the Myoview, this correlates with DFR/FFR  results.  Echocardiogram 11/11/2019: 1. Abnormal septal motion Inferior basal hypokinesis . Left ventricular  ejection fraction, by estimation, is 50 to 55%. The left ventricle has low  normal function. The left ventricle has no regional wall motion  abnormalities. Left ventricular diastolic  parameters are consistent with Grade I diastolic dysfunction (impaired  relaxation).  2. Right ventricular systolic function is normal. The right ventricular  size is normal.  3. Left atrial size was mildly dilated.  4. The mitral valve is normal in structure. No evidence of mitral valve  regurgitation. No evidence of mitral stenosis.  5. The aortic valve is tricuspid. Aortic valve regurgitation is trivial.  Mild to moderate aortic valve sclerosis/calcification is present, without  any evidence of aortic stenosis.  6. The inferior vena cava is normal in size with greater than 50%  respiratory variability, suggesting right atrial pressure of 3 mmHg.   Assessment and Plan:  1.  Shortness of breath as discussed above.  Recent ER work-up and testing reviewed.  No clear evidence of ACS, ECG nonspecific.  No pulmonary embolus by chest CT, anterior mediastinal lymph nodes enlarged.  No cough, fevers or chills.  Cardiac work-up from last year found nonobstructive CAD.  We will plan a follow-up echocardiogram to ensure no decrease in LVEF.  2.  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  Medication Adjustments/Labs and Tests Ordered: Current medicines are reviewed at length with the patient today.  Concerns regarding medicines are outlined above.   Tests Ordered: Orders Placed This Encounter  Procedures  . ECHOCARDIOGRAM COMPLETE    Medication Changes: No orders of the defined types were placed in this encounter.   Disposition:  Follow up test results.  Signed, Satira Sark, MD, [facility] 01/14/2021 1:29 PM    [facility] [place], [place],  [place] [address] Phone: [phone]; Fax: [phone]

## 2021-01-14 NOTE — Patient Instructions (Signed)

## 2021-01-16 ENCOUNTER — Ambulatory Visit (INDEPENDENT_AMBULATORY_CARE_PROVIDER_SITE_OTHER): Payer: Medicare Other

## 2021-01-16 DIAGNOSIS — R0602 Shortness of breath: Secondary | ICD-10-CM | POA: Diagnosis not present

## 2021-01-16 LAB — ECHOCARDIOGRAM COMPLETE
AV Vena cont: 0.22 cm
Area-P 1/2: 2.14 cm2
Calc EF: 56 %
P 1/2 time: 1175 msec
S' Lateral: 2.74 cm
Single Plane A2C EF: 55.5 %
Single Plane A4C EF: 56 %

## 2021-01-17 ENCOUNTER — Encounter: Payer: Self-pay | Admitting: *Deleted

## 2021-01-18 ENCOUNTER — Telehealth: Payer: Self-pay | Admitting: *Deleted

## 2021-01-18 NOTE — Telephone Encounter (Signed)
-----   Message from Satira Sark, MD sent at 01/16/2021 10:01 AM EDT ----- Results reviewed.  Echocardiogram shows normal LVEF at 55 to 60%, no progressive valvular abnormalities.  Unless his symptoms worsen, we can likely go back to routine follow-up in 6 months.

## 2021-01-18 NOTE — Telephone Encounter (Signed)
Patient informed. Copy sent to PCP °

## 2021-01-21 ENCOUNTER — Ambulatory Visit (INDEPENDENT_AMBULATORY_CARE_PROVIDER_SITE_OTHER): Payer: Medicare Other | Admitting: Neurology

## 2021-01-21 ENCOUNTER — Encounter: Payer: Self-pay | Admitting: Neurology

## 2021-01-21 VITALS — BP 124/74 | HR 70 | Ht 71.0 in | Wt 211.0 lb

## 2021-01-21 DIAGNOSIS — R5383 Other fatigue: Secondary | ICD-10-CM

## 2021-01-21 DIAGNOSIS — I25119 Atherosclerotic heart disease of native coronary artery with unspecified angina pectoris: Secondary | ICD-10-CM

## 2021-01-21 DIAGNOSIS — R200 Anesthesia of skin: Secondary | ICD-10-CM

## 2021-01-21 DIAGNOSIS — E538 Deficiency of other specified B group vitamins: Secondary | ICD-10-CM

## 2021-01-21 DIAGNOSIS — C61 Malignant neoplasm of prostate: Secondary | ICD-10-CM

## 2021-01-21 DIAGNOSIS — G620 Drug-induced polyneuropathy: Secondary | ICD-10-CM

## 2021-01-21 DIAGNOSIS — R29898 Other symptoms and signs involving the musculoskeletal system: Secondary | ICD-10-CM | POA: Diagnosis not present

## 2021-01-21 DIAGNOSIS — R270 Ataxia, unspecified: Secondary | ICD-10-CM

## 2021-01-21 DIAGNOSIS — E139 Other specified diabetes mellitus without complications: Secondary | ICD-10-CM | POA: Diagnosis not present

## 2021-01-21 DIAGNOSIS — G7000 Myasthenia gravis without (acute) exacerbation: Secondary | ICD-10-CM | POA: Diagnosis not present

## 2021-01-21 DIAGNOSIS — C7951 Secondary malignant neoplasm of bone: Secondary | ICD-10-CM

## 2021-01-21 DIAGNOSIS — E5111 Dry beriberi: Secondary | ICD-10-CM

## 2021-01-21 DIAGNOSIS — G541 Lumbosacral plexus disorders: Secondary | ICD-10-CM

## 2021-01-21 DIAGNOSIS — G5783 Other specified mononeuropathies of bilateral lower limbs: Secondary | ICD-10-CM

## 2021-01-21 DIAGNOSIS — C801 Malignant (primary) neoplasm, unspecified: Secondary | ICD-10-CM

## 2021-01-21 DIAGNOSIS — R7309 Other abnormal glucose: Secondary | ICD-10-CM

## 2021-01-21 DIAGNOSIS — W19XXXA Unspecified fall, initial encounter: Secondary | ICD-10-CM

## 2021-01-21 NOTE — Patient Instructions (Signed)
Extensive blood work Repeat MRi lumbar spine and emg/ncs

## 2021-01-21 NOTE — Progress Notes (Signed)
[facility]    Provider:  Dr Jaynee Eagles Requesting Provider: Erline Levine, MD Primary Care Provider:  Eber Hong, MD  CC:  back pain, leg weakness  HPI:  Mitchell [name] is a 87 y.o. male here as requested by Dr. Vertell Limber or numbness and tingling, back pain.  Past medical history includes coronary artery disease disease, hypertension, gout, herpes zoster, hyperlipidemia, idiopathic scoliosis, lumbosacral spondylosis, prostate cancer.   Back pain started in December, no falls, no injuries, low in the middle, no radicular symptoms mostly in the low back central, achy, stiff with some sharp pains, in inciting events or falls. Numbness in the feet started after chemotherapy, 7 years ago and possibly a little worse since then but not directly related to the back pain. He went to [facility] ortho for the back pain which is the concerning problem, getting weaker in the legs, neuropathy has not progressed since the back issues per patient he feels the same but he does have swelling in the feet. He follows with [facility] for his prostate cancer closelt with PRT Scans and monthly injections. No facial weakness, no swallowing issues, speech issues.   I reviewed Dr. Melven Sartorius notes, he has history of prostate cancer with mets to the bone starting 7 years ago for which he has been on Lupron, complaining of 8 out of 10 pain and says he has been in pain since December and the pain is most severe in his low back and that he is lost feeling in both his feet.  He cannot lift his right leg on examination he has bilateral hip flexor weakness 4 out of 5 in bilateral extensor houses longus weakness 4 out of 5.  He has had 3 previous injections of which only gave him hours of relief at a time, in February of this year he had intercostal nerve block as well as right hip intra-articular injection and in March of this year he had a caudal epidural steroid injection.  He had an EMG of both lower extremities on November 12, 2020  which was felt to be consistent with bilateral S1 radiculopathies but the patient was not felt to have a significant peripheral neuropathy.  His lumbar MRI demonstrated bilateral severe L3-L4 stenosis of the foramen and left-sided L4-L5 foraminal stenosis and severe right L2-L3 foraminal stenosis.  His sensory exam is highly remarkable with numbness to the mid shin on the right into the knee on the left with absent vibratory sensation in a stocking distribution of numbness to pinprick.  Reflex testing was absent at the knees and ankles great toes downgoing to plantar stim.6-7 falls, legs are giving out, he feels his arms are doing well, no upper body symptoms all lower extre,ity.   Reviewed notes, labs and imaging from outside physicians, which showed: see above  Review of Systems: Patient complains of symptoms per HPI as well as the following symptoms leg weakness and pain.  Pertinent negatives and positives per HPI. All others negative.   Social History   Socioeconomic History   Marital status: Married    Spouse name: Not on file   Number of children: 3   Years of education: 14   Highest education level: Not on file  Occupational History   Not on file  Tobacco Use   Smoking status: Never   Smokeless tobacco: Never  Substance and Sexual Activity   Alcohol use: Not Currently    Alcohol/week: 0.0 - 1.0 standard drinks    Comment: "at best"  Drug use: Never   Sexual activity: Not on file  Other Topics Concern   Not on file  Social History Narrative   Lives with wife at home    Right handed   Caffeine: 1 cup/day   Social Determinants of Health   Financial Resource Strain: Not on file  Food Insecurity: Not on file  Transportation Needs: Not on file  Physical Activity: Not on file  Stress: Not on file  Social Connections: Not on file  Intimate Partner Violence: Not on file    Family History  Problem Relation Age of Onset   Heart failure Mother    Heart failure Father     CAD Father    Cancer Sister    CAD Brother    Stroke Brother    Neuropathy Neg Hx     Past Medical History:  Diagnosis Date   Allergic rhinitis    CAD (coronary artery disease)    Moderate, nonobstructive disease   Dupuytren's contracture    Right hand   Essential hypertension    GERD (gastroesophageal reflux disease)    Gout    Herpes zoster    twice   Hyperlipidemia    Idiopathic scoliosis in adult patient    Inguinal hernia    Right sided   Internal hemorrhoids    Lumbosacral spondylosis    Prostate cancer [facility])     Patient Active Problem List   Diagnosis Date Noted   Weakness of both lower extremities 01/21/2021   Atypical angina ([place]) 12/08/2019   DOE (dyspnea on exertion) 12/08/2019   Accelerating angina [facility])     Past Surgical History:  Procedure Laterality Date   HERNIA REPAIR Bilateral    INTRAVASCULAR PRESSURE WIRE/FFR STUDY N/A 12/08/2019   Procedure: INTRAVASCULAR PRESSURE WIRE/FFR STUDY;  Surgeon: Leonie Man, MD;  Location: [facility];  Service: Cardiovascular;  Laterality: N/A;   LEFT HEART CATH AND CORONARY ANGIOGRAPHY N/A 12/08/2019   Procedure: LEFT HEART CATH AND CORONARY ANGIOGRAPHY;  Surgeon: Leonie Man, MD;  Location: [facility];  Service: Cardiovascular;  Laterality: N/A;   TYMPANIC MEMBRANE REPAIR     VASECTOMY      Current Outpatient Medications  Medication Sig Dispense Refill   albuterol (VENTOLIN HFA) 108 (90 Base) MCG/ACT inhaler Inhale into the lungs every 6 (six) hours as needed for wheezing or shortness of breath.     allopurinol (ZYLOPRIM) 100 MG tablet Take 200 mg by mouth daily.      amLODipine (NORVASC) 2.5 MG tablet Take 2.5 mg by mouth daily.     aspirin EC 81 MG tablet Take 81 mg by mouth daily.     atorvastatin (LIPITOR) 10 MG tablet TAKE 1 TABLET BY MOUTH EVERY DAY 90 tablet 1   bisoprolol (ZEBETA) 5 MG tablet Take 5 mg by mouth daily.     colchicine 0.6 MG tablet Take 0.6 mg by mouth daily as  needed (gout).      gabapentin (NEURONTIN) 300 MG capsule Take 600 mg by mouth 3 (three) times daily.      indomethacin (INDOCIN) 25 MG capsule Take 25 mg by mouth 2 (two) times daily with a meal.     leuprolide, 6 Month, (ELIGARD) 45 MG injection Inject 45 mg into the skin every 6 (six) months.     meclizine (ANTIVERT) 25 MG tablet Take 25 mg by mouth 3 (three) times daily as needed for dizziness.     Multiple Vitamins-Minerals (MULTIVITAMIN WITH MINERALS) tablet  Take 1 tablet by mouth daily.     predniSONE (DELTASONE) 5 MG tablet Take 7.5 mg by mouth daily with breakfast.      Propylene Glycol (SYSTANE BALANCE) 0.6 % SOLN Place 1 drop into both eyes daily as needed (dry eyes).     hydrochlorothiazide (HYDRODIURIL) 25 MG tablet TAKE 1 TABLET BY MOUTH EVERY DAY 90 tablet 3   Current Facility-Administered Medications  Medication Dose Route Frequency Provider Last Rate Last Admin   sodium chloride flush (NS) 0.9 % injection 3 mL  3 mL Intravenous Q12H Satira Sark, MD        Allergies as of 01/21/2021 - Review Complete 01/21/2021  Allergen Reaction Noted   Lisinopril  11/01/2019   Losartan  11/01/2019   Penicillins Rash 11/01/2019    Vitals: BP 124/74 (BP Location: Right Arm, Patient Position: Sitting)   Pulse 70   Ht 5' 11"  (1.803 m)   Wt 211 lb (95.7 kg)   SpO2 96%   BMI 29.43 kg/m  Last Weight:  Wt Readings from Last 1 Encounters:  01/21/21 211 lb (95.7 kg)   Last Height:   Ht Readings from Last 1 Encounters:  01/21/21 5' 11"  (1.803 m)     Physical exam: Exam: Gen: NAD, conversant, well nourised, obese, well groomed                     CV: RRR, no MRG. No Carotid Bruits. +distal lower extremity peripheral edema, warm, nontender Eyes: Conjunctivae clear without exudates or hemorrhage  Neuro: Detailed Neurologic Exam  Speech:    Speech is normal; fluent and spontaneous with normal comprehension.  Cognition:    The patient is oriented to person, place, and  time;     recent and remote memory intact;     language fluent;     normal attention, concentration,     fund of knowledge Cranial Nerves:    The pupils are equal, round, and reactive to light. Pupils too small to visualize fundi.  Visual fields are full to finger confrontation. Extraocular movements are intact. Trigeminal sensation is intact and the muscles of mastication are normal. The face is symmetric. The palate elevates in the midline. Hearing intact. Voice is normal. Shoulder shrug is normal. The tongue has normal motion without fasciculations.   Coordination:    Normal finger to nose and heel to shin. Normal rapid alternating movements.   Gait: needs help getting out of chair, wide based gait, antagic and u    Heel-toe and tandem gait are normal.   Motor Observation:    No asymmetry, no atrophy, and no involuntary movements noted. Tone:    Normal muscle tone.    Posture:    Posture is stooped     Strength: prox uppers deltoid, triceps 4+ otherwise intact, lowere hip flexion 3+/5, leg flexion 4/5, 4/5 dorsiflexion foot (cannot walk on heels or toes) otherwise 5 to 5-.       Sensation: decreased to all modalities to the knees     Reflex Exam:  DTR's: hypo lowers, 1+ uppers.      Toes:    The toes are downgoing bilaterally.   Clonus:    Clonus is absent.    Assessment/Plan:  87 y.o. male here as requested by Eber Hong, MD for numbness and tingling, back pain, progressive lower extremity weakness.  Past medical history includes coronary artery disease disease, hypertension, gout, herpes zoster, hyperlipidemia, idiopathic scoliosis, lumbosacral spondylosis, prostate cancer.   -Very  lovely patient who is having progressive lower extremity weakness that is out of proportion to the to the MRI lumbar spine. He also has a hx of prostate cancer with bone mets. Large differential, possibly mets to the lumbar spine, may be neuromuscular such as myasthenia gravies (also some mild  prox UE weakness) or other neuromuscular cause, may be progression of his lumbar spine disease or other inflammatory/paraneoplastic/metastatic sequelae to the nerves or lumbosacral plexus, or many other causes. It is primarily in his legs so at this time do not feel the need for mri cervical or brain. MRI of the lumbar spine and MRI of the pelvis.(Patient's creatinine is borderline, hopefully we can use contrast on him or maybe a 1/2 dose?) - EMG/NCS: both legs and one arm.  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, per patient occurred with chemotherapy but slightly progressive, will order serum testing for other causes which may be contrinuting (he has been diabetic in the past)  Orders Placed This Encounter  Procedures   MR Lumbar Spine W Wo Contrast   MR PELVIS W WO CONTRAST   Vitamin B1   Vitamin B6   Hemoglobin A1c   B12 and Folate Panel   Methylmalonic acid, serum   TSH   Pan-ANCA   Sjogren's syndrome antibods(ssa + ssb)   Rheumatoid factor   Heavy metals, blood   Multiple Myeloma Panel (SPEP&IFE w/QIG)   ANA, IFA (with reflex)   CK   Acetylcholine receptor, binding   Acetylcholine receptor, blocking   Angiotensin converting enzyme   CBC with Differential/Platelets   Comprehensive metabolic panel   NCV with EMG(electromyography)   No orders of the defined types were placed in this encounter.   Cc: Eber Hong, MD,  Eber Hong, MD  Sarina Ill, MD  [facility] [address] [place], [place] [address]  Phone [phone] Fax [phone]  I spent over 80 minutes of face-to-face and non-face-to-face time with patient on the  1. Weakness of both lower extremities   2. Bilateral leg numbness   3. Other specified diabetes mellitus without complication, without long-term current use of insulin ([place])   4. Myasthenia gravis ([place])   5. Fall, initial encounter   6. Ataxia   7. Other specified mononeuropathies of bilateral lower limbs     8. Elevated glucose   9. 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 Other fatigue   13. Prostate cancer, primary, with metastasis from prostate to other site ([place])   14. Secondary malignancy of lumbar vertebral column ([place])   15. Lumbosacral plexopathy due to malignant neoplasm ([facility])    diagnosis.  This included previsit chart review, lab review, study review, order entry, electronic health record documentation, patient education on the different diagnostic and therapeutic options, counseling and coordination of care, risks and benefits of management, compliance, or risk factor reduction

## 2021-01-22 ENCOUNTER — Other Ambulatory Visit: Payer: Self-pay | Admitting: Cardiology

## 2021-01-22 DIAGNOSIS — J45909 Unspecified asthma, uncomplicated: Secondary | ICD-10-CM | POA: Insufficient documentation

## 2021-01-24 ENCOUNTER — Telehealth: Payer: Self-pay | Admitting: Neurology

## 2021-01-24 NOTE — Telephone Encounter (Signed)
MRI pelvis w/wo contrast & lumbar spine w/wo contrast auth: NPR   Sent to GI for scheduling.

## 2021-01-28 ENCOUNTER — Telehealth: Payer: Self-pay

## 2021-01-28 NOTE — Progress Notes (Signed)
See telephone note from 01/28/21 

## 2021-01-28 NOTE — Telephone Encounter (Signed)
Pt returned called, stated he has upcoming appt with PCP and will FU with [place] if needed.

## 2021-01-28 NOTE — Telephone Encounter (Signed)
LVM per DPR informing pt that he is pre-diabetic and he also has stable elevated creatinine (CKD). Both of these can make the neuropathy in his feet worse. Dr.Ahern recommended following up with primary care to work on his prediabetes). Creatinine is stable, no changes. If you have any questions please give [place] a call

## 2021-02-05 ENCOUNTER — Ambulatory Visit
Admission: RE | Admit: 2021-02-05 | Discharge: 2021-02-05 | Disposition: A | Discharge status: Home / Self Care | Payer: Medicare Other | Source: Ambulatory Visit | Attending: Neurology | Admitting: Neurology

## 2021-02-05 ENCOUNTER — Other Ambulatory Visit: Payer: Self-pay

## 2021-02-05 DIAGNOSIS — R200 Anesthesia of skin: Secondary | ICD-10-CM

## 2021-02-05 DIAGNOSIS — R270 Ataxia, unspecified: Secondary | ICD-10-CM

## 2021-02-05 DIAGNOSIS — W19XXXA Unspecified fall, initial encounter: Secondary | ICD-10-CM

## 2021-02-05 DIAGNOSIS — C7951 Secondary malignant neoplasm of bone: Secondary | ICD-10-CM

## 2021-02-05 DIAGNOSIS — R29898 Other symptoms and signs involving the musculoskeletal system: Secondary | ICD-10-CM

## 2021-02-05 DIAGNOSIS — G541 Lumbosacral plexus disorders: Secondary | ICD-10-CM

## 2021-02-05 DIAGNOSIS — C61 Malignant neoplasm of prostate: Secondary | ICD-10-CM

## 2021-02-05 MED ORDER — GADOBENATE DIMEGLUMINE 529 MG/ML IV SOLN
20.0000 mL | Freq: Once | INTRAVENOUS | Status: AC | PRN
  Administered 2021-02-05: 20 mL via INTRAVENOUS

## 2021-02-07 LAB — PAN-ANCA
ANCA Proteinase 3: <3.5 U/mL (ref 0.0–3.5)
Atypical pANCA: <1:20 {titer}
C-ANCA: <1:20 {titer}
Myeloperoxidase Ab: <9 U/mL (ref 0.0–9.0)
P-ANCA: <1:20 {titer}

## 2021-02-07 LAB — COMPREHENSIVE METABOLIC PANEL
ALT: 21 IU/L (ref 0–44)
AST: 29 IU/L (ref 0–40)
Albumin/Globulin Ratio: 1.8 (ref 1.2–2.2)
Albumin: 4.2 g/dL (ref 3.6–4.6)
Alkaline Phosphatase: 92 IU/L (ref 44–121)
BUN/Creatinine Ratio: 15 (ref 10–24)
BUN: 22 mg/dL (ref 8–27)
Bilirubin Total: 0.4 mg/dL (ref 0.0–1.2)
CO2: 21 mmol/L (ref 20–29)
Calcium: 10 mg/dL (ref 8.6–10.2)
Chloride: 100 mmol/L (ref 96–106)
Creatinine, Ser: 1.46 mg/dL — ABNORMAL HIGH (ref 0.76–1.27)
Globulin, Total: 2.4 g/dL (ref 1.5–4.5)
Glucose: 135 mg/dL — ABNORMAL HIGH (ref 65–99)
Potassium: 4.5 mmol/L (ref 3.5–5.2)
Sodium: 140 mmol/L (ref 134–144)
Total Protein: 6.6 g/dL (ref 6.0–8.5)
eGFR: 47 mL/min/{1.73_m2} — ABNORMAL LOW (ref >59)

## 2021-02-07 LAB — CBC WITH DIFFERENTIAL/PLATELET
Basophils Absolute: 0.1 10*3/uL (ref 0.0–0.2)
Basos: 1 %
EOS (ABSOLUTE): 0 10*3/uL (ref 0.0–0.4)
Eos: 0 %
Hematocrit: 43.6 % (ref 37.5–51.0)
Hemoglobin: 14.2 g/dL (ref 13.0–17.7)
Immature Grans (Abs): 0.1 10*3/uL (ref 0.0–0.1)
Immature Granulocytes: 1 %
Lymphocytes Absolute: 1.2 10*3/uL (ref 0.7–3.1)
Lymphs: 12 %
MCH: 30.2 pg (ref 26.6–33.0)
MCHC: 32.6 g/dL (ref 31.5–35.7)
MCV: 93 fL (ref 79–97)
Monocytes Absolute: 0.7 10*3/uL (ref 0.1–0.9)
Monocytes: 7 %
Neutrophils Absolute: 8.1 10*3/uL — ABNORMAL HIGH (ref 1.4–7.0)
Neutrophils: 79 %
Platelets: 247 10*3/uL (ref 150–450)
RBC: 4.7 x10E6/uL (ref 4.14–5.80)
RDW: 13.3 % (ref 11.6–15.4)
WBC: 10.2 10*3/uL (ref 3.4–10.8)

## 2021-02-07 LAB — ANTINUCLEAR ANTIBODIES, IFA: ANA Titer 1: NEGATIVE

## 2021-02-07 LAB — HEAVY METALS, BLOOD
Arsenic: 1 ug/L (ref 0–9)
Lead, Blood: 1 ug/dL (ref 0–4)
Mercury: <1 ug/L (ref 0.0–14.9)

## 2021-02-07 LAB — MULTIPLE MYELOMA PANEL, SERUM
Albumin SerPl Elph-Mcnc: 3.8 g/dL (ref 2.9–4.4)
Albumin/Glob SerPl: 1.4 (ref 0.7–1.7)
Alpha 1: 0.3 g/dL (ref 0.0–0.4)
Alpha2 Glob SerPl Elph-Mcnc: 1 g/dL (ref 0.4–1.0)
B-Globulin SerPl Elph-Mcnc: 1.1 g/dL (ref 0.7–1.3)
Gamma Glob SerPl Elph-Mcnc: 0.5 g/dL (ref 0.4–1.8)
Globulin, Total: 2.8 g/dL (ref 2.2–3.9)
IgA/Immunoglobulin A, Serum: 163 mg/dL (ref 61–437)
IgG (Immunoglobin G), Serum: 617 mg/dL (ref 603–1613)
IgM (Immunoglobulin M), Srm: 41 mg/dL (ref 15–143)

## 2021-02-07 LAB — METHYLMALONIC ACID, SERUM: Methylmalonic Acid: 235 nmol/L (ref 0–378)

## 2021-02-07 LAB — HEMOGLOBIN A1C
Est. average glucose Bld gHb Est-mCnc: 131 mg/dL
Hgb A1c MFr Bld: 6.2 % — ABNORMAL HIGH (ref 4.8–5.6)

## 2021-02-07 LAB — ACETYLCHOLINE RECEPTOR, BINDING: AChR Binding Ab, Serum: <0.03 nmol/L (ref 0.00–0.24)

## 2021-02-07 LAB — ANGIOTENSIN CONVERTING ENZYME: Angio Convert Enzyme: 53 U/L (ref 14–82)

## 2021-02-07 LAB — VITAMIN B1: Thiamine: 195.1 nmol/L (ref 66.5–200.0)

## 2021-02-07 LAB — RHEUMATOID FACTOR: Rheumatoid fact SerPl-aCnc: <10 IU/mL (ref <14.0)

## 2021-02-07 LAB — TSH: TSH: 1.14 u[IU]/mL (ref 0.450–4.500)

## 2021-02-07 LAB — SJOGREN'S SYNDROME ANTIBODS(SSA + SSB)
ENA SSA (RO) Ab: <0.2 AI (ref 0.0–0.9)
ENA SSB (LA) Ab: <0.2 AI (ref 0.0–0.9)

## 2021-02-07 LAB — B12 AND FOLATE PANEL
Folate: >20 ng/mL (ref >3.0)
Vitamin B-12: 854 pg/mL (ref 232–1245)

## 2021-02-07 LAB — CK: Total CK: 45 U/L (ref 30–208)

## 2021-02-07 LAB — ACETYLCHOLINE RECEPTOR, BLOCKING: Acetylchol Block Ab: 16 % (ref 0–25)

## 2021-02-07 LAB — VITAMIN B6: Vitamin B6: 87 ug/L — ABNORMAL HIGH (ref 3.4–65.2)

## 2021-02-14 ENCOUNTER — Ambulatory Visit (INDEPENDENT_AMBULATORY_CARE_PROVIDER_SITE_OTHER): Payer: Medicare Other | Admitting: Neurology

## 2021-02-14 ENCOUNTER — Telehealth: Payer: Self-pay | Admitting: *Deleted

## 2021-02-14 ENCOUNTER — Other Ambulatory Visit: Payer: Self-pay

## 2021-02-14 DIAGNOSIS — I25119 Atherosclerotic heart disease of native coronary artery with unspecified angina pectoris: Secondary | ICD-10-CM | POA: Diagnosis not present

## 2021-02-14 DIAGNOSIS — R200 Anesthesia of skin: Secondary | ICD-10-CM | POA: Diagnosis not present

## 2021-02-14 DIAGNOSIS — R270 Ataxia, unspecified: Secondary | ICD-10-CM | POA: Diagnosis not present

## 2021-02-14 DIAGNOSIS — Z0289 Encounter for other administrative examinations: Secondary | ICD-10-CM

## 2021-02-14 DIAGNOSIS — G5783 Other specified mononeuropathies of bilateral lower limbs: Secondary | ICD-10-CM | POA: Diagnosis not present

## 2021-02-14 DIAGNOSIS — R29898 Other symptoms and signs involving the musculoskeletal system: Secondary | ICD-10-CM | POA: Diagnosis not present

## 2021-02-14 DIAGNOSIS — W19XXXA Unspecified fall, initial encounter: Secondary | ICD-10-CM

## 2021-02-14 DIAGNOSIS — S22080A Wedge compression fracture of T11-T12 vertebra, initial encounter for closed fracture: Secondary | ICD-10-CM

## 2021-02-14 NOTE — Telephone Encounter (Signed)
I called pt and LMVM for him confirming Dr. Jaynee Eagles gave results to him when in for [place]/EMG this am of the MRI lumbar.

## 2021-02-14 NOTE — Progress Notes (Addendum)
History: 87 y.o. male here as requested by Eber Hong, MD for numbness and tingling, back pain, progressive lower extremity weakness.  Past medical history includes coronary artery disease disease, hypertension, gout, herpes zoster, hyperlipidemia, idiopathic scoliosis, lumbosacral spondylosis, prostate cancer. Discussed emg/ncs findings today and workup to date as follows:  - Most significant finding on repeat MRI lumbar spine was acute to subacute compression fracture at T12 vertebral body with 70% loss of vertebral body height and marrow edema. Referral back to Dr. Vertell Limber for evaluation of vertebroplasty, this may be the source of his severe back pain.Also L3/l4 mod to severe stenosis, suggestion is to monitor and not pursue surgery for stenmosis.  - EMG/NCS: length dependent, axonal, moderately-severe polyneuropathy.  No suggestion of radiculopathy, myositis/myopathy or muscle disease. 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 Patient is also pre-diabetic with CKD which can both make the neuropathy in his feet worse. I would follow up with primary care to work on his prediabetes. No other causes found on extensive workup for his polyneuropathy. - Myasthenia gravis labs negative. CK normal. No suggestion of neuromuscular disorder - MRI Pelvis unremarkable lumbosacral plexus.     IMPRESSION: 02/05/2021 (reviewed images with patient today. Also informed Dr. Vertell Limber of findings)   MRI lumbar spine with and without contrast demonstrating: - Acute to subacute compression fracture at T12 vertebral body with 70% loss of vertebral body height and marrow edema.  This is new finding compared to xray 11/20/20 and MRI 08/16/2020. - At L3-4: Disc bulging, facet hypertrophy, ligamentum flavum hypertrophy, posterior epidural lipomatosis, resulting in moderate to severe spinal stenosis and severe bilateral foraminal stenosis. - At L1-2: Disc bulging and uncovertebral joint hypertrophy with severe right  foraminal stenosis. - At L2-3: Mild disc bulging and facet perjury with mild right foraminal stenosis. - At L4-5: Facet hypertrophy with mild left foraminal stenosis.   MRI Pelvis (examining the brachial plexus): IMPRESSION: 1. Unremarkable lumbosacral plexus. 2. Mild bilateral hip osteoarthritis. 3. Mild bilateral gluteus medius and hamstring tendinosis.  I spent over 40 minutes of face-to-face and non-face-to-face time with patient on the  1. Weakness of both lower extremities   2. Bilateral leg numbness   3. Fall, initial encounter   4. Ataxia   5. Other specified mononeuropathies of bilateral lower limbs     diagnosis.  This included previsit chart review, lab review, study review, order entry, electronic health record documentation, patient education on the different diagnostic and therapeutic options, counseling and coordination of care, risks and benefits of management, compliance, or risk factor reduction. This does not include time spent on emg/ncs.

## 2021-02-14 NOTE — Progress Notes (Signed)
See procedure note.

## 2021-02-14 NOTE — Progress Notes (Signed)
Full Name: Mitchell [name] Gender: Male MRN #: [id number] Date of Birth: 07/01/1934    Visit Date: 02/14/2021 07:36 Age: 87 Years Examining Physician: Sarina Ill, MD  Referring Physician: Erline Levine MD Height: 5 feet 11 inch Patient History: 211lbs  History: Low back pain, leg weakness, polyneuropathy  Summary: Nerve conduction studies performed on the bilateral lower extremities and right upper extremities.  EMG conduction study performed on the bilateral lower extremities.  The right peroneal motor nerve showed no response.  The left peroneal motor nerve showed no response.  The right tibial motor nerve showed no response.  The left tibial motor nerve showed no response.  The right radial sensory nerve showed reduced amplitude (10 V, normal greater than 15).  The right sural sensory nerve showed no response.  The left sural sensory nerve showed no response.  The right superficial peroneal sensory nerve showed no response.  The left superficial peroneal sensory nerve showed no response.  The right ulnar orthodromic sensory nerve showed no response.  The right tibial F wave showed no response.  The left tibial F wave showed no response.  The right ulnar F wave was delayed (34.9 ms, normal less than 32).  All remaining nerves (as indicated in the following tables) were within normal limits.  The left vastus medialis, right vastus medialis, left tibialis anterior, right tibialis anterior, left extensor houses longus, right extensor hallux longus, the left gastrocnemius and the right gastrocnemius showed reduced motor unit recruitment and polyphasic motor units.All remaining  muscles (as indicated in the following tables) were within normal limits.        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  No suggestion of radiculopathy, myositis/myopathy or other muscle disease.  Sarina Ill M.D.  [facility] [address], [place], [place] [address] Tel: [phone] Fax: [phone]  Verbal informed consent was obtained from the patient, patient was informed of potential risk of procedure, including bruising, bleeding, hematoma formation, infection, muscle weakness, muscle pain, numbness, among others.        Mitchell [name]    Nerve / Sites Muscle Latency Ref. Amplitude Ref. Rel Amp Segments Distance Velocity Ref. Area    ms ms mV mV %  cm m/s m/s mVms  R Ulnar - ADM     Wrist ADM 3.1 ?3.3 6.9 ?6.0 100 Wrist - ADM 7   16.9     B.Elbow ADM 7.5  6.2  88.8 B.Elbow - Wrist 22 50 ?49 20.9     A.Elbow ADM 9.5  5.5  89.6 A.Elbow - B.Elbow 10 49 ?49 16.3  R Peroneal - EDB     Ankle EDB NR ?6.5 NR ?2.0 NR Ankle - EDB 9   NR     Fib head EDB NR  NR  NR Fib head - Ankle 29 NR ?44 NR     Pop fossa EDB NR  NR  NR Pop fossa - Fib head 10 NR ?44 NR         Pop fossa - Ankle      L Peroneal - EDB     Ankle EDB NR ?6.5 NR ?2.0 NR Ankle - EDB 9   NR     Fib head EDB NR  NR  NR Fib head - Ankle 29 NR ?44 NR     Pop fossa EDB NR  NR  NR Pop fossa - Fib head 10 NR ?44 NR  Pop fossa - Ankle      R Tibial - AH     Ankle AH NR ?5.8 NR ?4.0 NR Ankle - AH 9   NR     Pop fossa AH NR  NR  NR Pop fossa - Ankle 42 NR ?41 NR  L Tibial - AH     Ankle AH NR ?5.8 NR ?4.0 NR Ankle - AH 9   NR     Pop fossa AH NR  NR  NR Pop fossa - Ankle 40 NR ?41 NR               SNC    Nerve / Sites Rec. Site Peak Lat Ref.  Amp Ref. Segments Distance    ms ms V V  cm  R Radial - Anatomical snuff box (Forearm)     Forearm Wrist 2.9 ?2.9 12 ?15 Forearm - Wrist 10  R Sural - Ankle (Calf)     Calf Ankle NR ?4.4 NR ?6 Calf - Ankle 10  L Sural - Ankle (Calf)     Calf Ankle NR ?4.4 NR ?6 Calf - Ankle 10  R Superficial peroneal - Ankle     Lat leg Ankle NR ?4.4 NR ?6 Lat leg - Ankle 10  L Superficial peroneal - Ankle     Lat leg Ankle NR ?4.4 NR ?6 Lat leg - Ankle 10  R Ulnar - Orthodromic, (Dig V, Mid palm)     Dig V Wrist NR ?3.1 NR ?5 Dig V -  Wrist 67                 F  Wave    Nerve F Lat Ref.   ms ms  R Tibial - AH NR ?56.0  R Ulnar - ADM 34.9 ?32.0  L Tibial - AH NR ?56.0           EMG Summary Table    Spontaneous MUAP Recruitment  Muscle IA Fib PSW Fasc Other Amp Dur. Poly Pattern  R. Iliopsoas Normal None None None _______ Normal Normal Normal Normal  L. Iliopsoas Normal None None None _______ Normal Normal Normal Normal  L. Vastus medialis Normal None None None _______ Normal Normal 2+ Reduced  R. Vastus medialis Normal None None None _______ Normal Normal 2+ Reduced  L. Tibialis anterior Normal None None None _______ Normal Normal 2+ Reduced  R. Tibialis anterior Normal None None None _______ Normal Normal 2+ Reduced  L. Extensor hallucis longus Normal None None None _______ Normal Normal 2+ Reduced  R. Extensor hallucis longus Normal None None None _______ Normal Normal 2+ Reduced  L. Gastrocnemius (Medial head) Normal None None None _______ Normal Normal 2+ Reduced  R. Gastrocnemius (Medial head) Normal None None None _______ Normal Normal 2+ Reduced  L. Lumbar paraspinals (low) Normal None None None _______ Normal Normal Normal Normal  R. Lumbar paraspinals (low) Normal None None None _______ Normal Normal Normal Normal  L. Biceps femoris (long head) Normal None None None _______ Normal Normal Normal Normal  R. Biceps femoris (long head) Normal None None None _______ Normal Normal Normal Normal  L. Gluteus maximus Normal None None None _______ Normal Normal Normal Normal  R. Gluteus maximus Normal None None None _______ Normal Normal Normal Normal  L. Gluteus medius Normal None None None _______ Normal Normal Normal Normal  R. Gluteus medius Normal None None None _______ Normal Normal Normal Normal  R. Abductor hallucis Normal None None None _______  Could not illicit motor units

## 2021-02-14 NOTE — Patient Instructions (Signed)
Spinal Compression Fracture A spinal compression fracture is a collapse of the bones that form the spine (vertebrae). With this type of fracture, the vertebrae become pushed (compressed) into a wedge shape. Most compression fractures happen in the middle or lower part of the spine. What are the causes? This condition may be caused by: Thinning and loss of density in the bones (osteoporosis). This is the most common cause. A fall. A car or motorcycle accident. Cancer. Trauma, such as a heavy, direct hit to the head or back. What increases the risk? You are more likely to develop this condition if: You are 60 years of age or older. You have osteoporosis. You have certain types of cancer, including: Multiple myeloma. Lymphoma. Prostate cancer. Lung cancer. Breast cancer. What are the signs or symptoms? Symptoms of this condition include: Severe pain with simple movements such as coughing or sneezing. Pain that gets worse over time. Pain that is worse when you stand, walk, sit, or bend. Sudden pain that is so bad that it is hard for you to move. Bending or humping of the spine. Gradual loss of height. Numbness, tingling, or weakness in the back and legs. Trouble walking. Your symptoms will depend on the cause of the fracture and how quickly it develops. How is this diagnosed? This condition may be diagnosed based on symptoms, medical history, and a physical exam. During the physical exam, your health care provider may tap along the length of your spine to check for tenderness. Tests may be done to confirm the diagnosis. They may include: A bone mineral density test to check for osteoporosis. Imaging tests, such as a spine X-ray, CT scan, or MRI. How is this treated? Treatment depends on the cause and severity of the condition. Some fractures may heal on their own with supportive care. Treatment may include: Pain medicine. Rest. A back brace. Physical therapy exercises. Medicine to  strengthen bone. Calcium and vitamin D supplements. Fractures that cause the back to become misshapen, cause nerve pain or weakness, or do not respond to other treatment may be treated with surgery. This may include: Vertebroplasty. Bone cement is injected into the collapsed vertebrae to stabilize them. Balloon kyphoplasty. The collapsed vertebrae are expanded with a balloon and then bone cement is injected into them. Spinal fusion. The collapsed vertebrae are connected (fused) to normal vertebrae. Follow these instructions at home: Medicines Take over-the-counter and prescription medicines only as told by your health care provider. Ask your health care provider if the medicine prescribed to you: Requires you to avoid driving or using machinery. Can cause constipation. You may need to take these actions to prevent or treat constipation: Drink enough fluid to keep your urine pale yellow. Take over-the-counter or prescription medicines. Eat foods that are high in fiber, such as beans, whole grains, and fresh fruits and vegetables. Limit foods that are high in fat and processed sugars, such as fried or sweet foods. If you have a brace: Wear the brace as told by your health care provider. Remove it only as told by your health care provider. Loosen the brace if your fingers or toes tingle, become numb, or turn cold and blue. Keep the brace clean. If the brace is not waterproof: Do not let it get wet. Cover it with a watertight covering when you take a bath or a shower. Managing pain, stiffness, and swelling  If directed, put ice on the injured area. To do this: If you have a removable brace, remove it as told   by your health care provider. Put ice in a plastic bag. Place a towel between your skin and the bag. Leave the ice on for 20 minutes, 2-3 times a day. Remove the ice if your skin turns bright red. This is very important. If you cannot feel pain, heat, or cold, you have a greater risk of  damage to the area. Activity Rest as told by your health care provider. Avoid sitting for a long time without moving. Get up to take short walks every 1-2 hours. This is important to improve blood flow and breathing. Ask for help if you feel weak or unsteady. Return to your normal activities as told by your health care provider. Ask what activities are safe for you. Do physical therapy exercises to improve movement and strength in your back, as recommended by your health care provider. Exercise regularly as directed by your health care provider. General instructions  Do not drink alcohol. Alcohol can interfere with your treatment. Do not use any products that contain nicotine or tobacco, such as cigarettes, e-cigarettes, and chewing tobacco. These can delay bone healing. If you need help quitting, ask your health care provider. Keep all follow-up visits. This is important. It can help to prevent permanent injury, disability, and long-lasting (chronic) pain. Contact a health care provider if: You have a fever. Your pain medicine is not helping. Your pain does not get better over time. You cannot return to your normal activities as planned or expected. Get help right away if: Your pain is very bad and it suddenly gets worse. You are unable to move any body part (paralysis) that is below the level of your injury. You have numbness, tingling, or weakness in any body part that is below the level of your injury. You cannot control your bladder or bowels. Summary A spinal compression fracture is a collapse of the bones that form the spine (vertebrae). With this type of fracture, the vertebrae become pushed (compressed) into a wedge shape. Your symptoms and treatment will depend on the cause and severity of the fracture and how quickly it develops. Some fractures may heal on their own with supportive care. Fractures that cause the back to become misshapen, cause nerve pain or weakness, or do not  respond to other treatment may be treated with surgery. This information is not intended to replace advice given to you by your health care provider. Make sure you discuss any questions you have with your health care provider. Document Revised: 11/16/2019 Document Reviewed: 11/16/2019 Elsevier Patient Education  2022 [employer] Inc.  

## 2021-02-14 NOTE — Telephone Encounter (Signed)
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 We still need to progress to the emg/ncs and I will see him for that and we can discuss at that time. He has also had a compression fracture which is new from April. I'll see him on Thursday and also send this to Dr. Vertell Limber. thanks

## 2021-02-15 NOTE — Telephone Encounter (Signed)
Pt notified when in for [place]/EMG by Dr. Jaynee Eagles.

## 2021-02-18 ENCOUNTER — Telehealth: Payer: Self-pay

## 2021-02-18 DIAGNOSIS — S22080A Wedge compression fracture of T11-T12 vertebra, initial encounter for closed fracture: Secondary | ICD-10-CM | POA: Insufficient documentation

## 2021-02-18 NOTE — Procedures (Signed)
Full Name: Mitchell [name] Gender: Male MRN #: [id number] Date of Birth: 05-28-1934    Visit Date: 02/14/2021 07:36 Age: 87 Years Examining Physician: Sarina Ill, MD  Referring Physician: Erline Levine MD Height: 5 feet 11 inch Patient History: 211lbs  History: Low back pain, leg weakness, polyneuropathy  Summary: Nerve conduction studies performed on the bilateral lower extremities and right upper extremities.  EMG conduction study performed on the bilateral lower extremities.  The right peroneal motor nerve showed no response.  The left peroneal motor nerve showed no response.  The right tibial motor nerve showed no response.  The left tibial motor nerve showed no response.  The right radial sensory nerve showed reduced amplitude (10 V, normal greater than 15).  The right sural sensory nerve showed no response.  The left sural sensory nerve showed no response.  The right superficial peroneal sensory nerve showed no response.  The left superficial peroneal sensory nerve showed no response.  The right ulnar orthodromic sensory nerve showed no response.  The right tibial F wave showed no response.  The left tibial F wave showed no response.  The right ulnar F wave was delayed (34.9 ms, normal less than 32).  All remaining nerves (as indicated in the following tables) were within normal limits.  The left vastus medialis, right vastus medialis, left tibialis anterior, right tibialis anterior, left extensor houses longus, right extensor hallux longus, the left gastrocnemius and the right gastrocnemius showed reduced motor unit recruitment and polyphasic motor units.All remaining  muscles (as indicated in the following tables) were within normal limits.        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  No suggestion of radiculopathy, myositis/myopathy or other muscle disease.  Sarina Ill M.D.  [facility] [address], [place], [place] [address] Tel: [phone] Fax: [phone]  Verbal informed consent was obtained from the patient, patient was informed of potential risk of procedure, including bruising, bleeding, hematoma formation, infection, muscle weakness, muscle pain, numbness, among others.        [place]    Nerve / Sites Muscle Latency Ref. Amplitude Ref. Rel Amp Segments Distance Velocity Ref. Area    ms ms mV mV %  cm m/s m/s mVms  R Ulnar - ADM     Wrist ADM 3.1 ?3.3 6.9 ?6.0 100 Wrist - ADM 7   16.9     B.Elbow ADM 7.5  6.2  88.8 B.Elbow - Wrist 22 50 ?49 20.9     A.Elbow ADM 9.5  5.5  89.6 A.Elbow - B.Elbow 10 49 ?49 16.3  R Peroneal - EDB     Ankle EDB NR ?6.5 NR ?2.0 NR Ankle - EDB 9   NR     Fib head EDB NR  NR  NR Fib head - Ankle 29 NR ?44 NR     Pop fossa EDB NR  NR  NR Pop fossa - Fib head 10 NR ?44 NR         Pop fossa - Ankle      L Peroneal - EDB     Ankle EDB NR ?6.5 NR ?2.0 NR Ankle - EDB 9   NR     Fib head EDB NR  NR  NR Fib head - Ankle 29 NR ?44 NR     Pop fossa EDB NR  NR  NR Pop fossa - Fib head 10 NR ?44 NR  Pop fossa - Ankle      R Tibial - AH     Ankle AH NR ?5.8 NR ?4.0 NR Ankle - AH 9   NR     Pop fossa AH NR  NR  NR Pop fossa - Ankle 42 NR ?41 NR  L Tibial - AH     Ankle AH NR ?5.8 NR ?4.0 NR Ankle - AH 9   NR     Pop fossa AH NR  NR  NR Pop fossa - Ankle 40 NR ?41 NR               SNC    Nerve / Sites Rec. Site Peak Lat Ref.  Amp Ref. Segments Distance    ms ms V V  cm  R Radial - Anatomical snuff box (Forearm)     Forearm Wrist 2.9 ?2.9 12 ?15 Forearm - Wrist 10  R Sural - Ankle (Calf)     Calf Ankle NR ?4.4 NR ?6 Calf - Ankle 10  L Sural - Ankle (Calf)     Calf Ankle NR ?4.4 NR ?6 Calf - Ankle 10  R Superficial peroneal - Ankle     Lat leg Ankle NR ?4.4 NR ?6 Lat leg - Ankle 10  L Superficial peroneal - Ankle     Lat leg Ankle NR ?4.4 NR ?6 Lat leg - Ankle 10  R Ulnar - Orthodromic, (Dig V, Mid palm)     Dig V Wrist NR ?3.1 NR ?5 Dig V -  Wrist 50                 F  Wave    Nerve F Lat Ref.   ms ms  R Tibial - AH NR ?56.0  R Ulnar - ADM 34.9 ?32.0  L Tibial - AH NR ?56.0           EMG Summary Table    Spontaneous MUAP Recruitment  Muscle IA Fib PSW Fasc Other Amp Dur. Poly Pattern  R. Iliopsoas Normal None None None _______ Normal Normal Normal Normal  L. Iliopsoas Normal None None None _______ Normal Normal Normal Normal  L. Vastus medialis Normal None None None _______ Normal Normal 2+ Reduced  R. Vastus medialis Normal None None None _______ Normal Normal 2+ Reduced  L. Tibialis anterior Normal None None None _______ Normal Normal 2+ Reduced  R. Tibialis anterior Normal None None None _______ Normal Normal 2+ Reduced  L. Extensor hallucis longus Normal None None None _______ Normal Normal 2+ Reduced  R. Extensor hallucis longus Normal None None None _______ Normal Normal 2+ Reduced  L. Gastrocnemius (Medial head) Normal None None None _______ Normal Normal 2+ Reduced  R. Gastrocnemius (Medial head) Normal None None None _______ Normal Normal 2+ Reduced  L. Lumbar paraspinals (low) Normal None None None _______ Normal Normal Normal Normal  R. Lumbar paraspinals (low) Normal None None None _______ Normal Normal Normal Normal  L. Biceps femoris (long head) Normal None None None _______ Normal Normal Normal Normal  R. Biceps femoris (long head) Normal None None None _______ Normal Normal Normal Normal  L. Gluteus maximus Normal None None None _______ Normal Normal Normal Normal  R. Gluteus maximus Normal None None None _______ Normal Normal Normal Normal  L. Gluteus medius Normal None None None _______ Normal Normal Normal Normal  R. Gluteus medius Normal None None None _______ Normal Normal Normal Normal  R. Abductor hallucis Normal None None None _______  Could not illicit motor units

## 2021-02-18 NOTE — Telephone Encounter (Signed)
Referral sent to [facility]. P: [phone]

## 2021-02-18 NOTE — Addendum Note (Signed)
Addended by: Sarina Ill B on: 02/18/2021 05:03 PM   Modules accepted: Orders

## 2021-02-27 DIAGNOSIS — R030 Elevated blood-pressure reading, without diagnosis of hypertension: Secondary | ICD-10-CM | POA: Insufficient documentation

## 2021-02-27 DIAGNOSIS — S22080G Wedge compression fracture of T11-T12 vertebra, subsequent encounter for fracture with delayed healing: Secondary | ICD-10-CM | POA: Insufficient documentation

## 2021-02-28 ENCOUNTER — Other Ambulatory Visit: Payer: Self-pay | Admitting: Neurosurgery

## 2021-03-08 NOTE — Pre-Procedure Instructions (Signed)
[name]  03/08/2021      Your procedure is scheduled on Thursday, August 4.  Report to [facility], Main Entrance or Entrance "A" at  2:00 PM .                Your surgery or procedure is scheduled to begin at 4:00 PM   Call this number if you have problems the morning of surgery: [phone]  This is the number for the Pre- Surgical Desk.                For any other questions, please call [phone], Monday - Friday 8 AM - 4 PM.   Remember:  Do not eat  after midnight on August 3.  You may drink clear liquids until 1:00 PM .   Clear liquids allowed are:                     Water, Juice (non-citric and without pulp - diabetics please choose diet or no sugar options), Carbonated beverages - (diabetics please choose diet or no sugar options), Clear Tea, Black Coffee only (no creamer, milk or cream including half and half), Plain Jell-O only (diabetics please choose diet or no sugar options), Gatorade (diabetics please choose diet or no sugar options), and Plain Popsicles only   Take these medicines the morning of surgery with A SIP OF WATER: allopurinol (ZYLOPRIM) amLODipine (NORVASC)  atorvastatin (LIPITOR bisoprolol (ZEBETA) gabapentin (NEURONTIN) predniSONE (DELTASONE)  Take if needed: colchicine  fluticasone (FLONASE) meclizine (ANTIVERT) traMADol (ULTRAM) albuterol (VENTOLIN HFA) inhaler- bring it to the hospital with you.  Follow Dr. Melven Sartorius instructions regarding Aspirin.  STOP taking Aspirin Products (Goody Powder, Excedrin Migraine), Ibuprofen (Advil), Naproxen (Aleve), Vitamins and Herbal Products (ie Fish Oil).    Special instructions:    [facility]- Preparing For Surgery  Before surgery, you can play an important role. Because skin is not sterile, your skin needs to be as free of germs as possible. You can reduce the number of germs on your skin by washing with CHG (chlorahexidine gluconate) Soap before surgery.  CHG is an antiseptic cleaner  which kills germs and bonds with the skin to continue killing germs even after washing.    Oral Hygiene is also important to reduce your risk of infection.  Remember - BRUSH YOUR TEETH THE MORNING OF SURGERY WITH YOUR REGULAR TOOTHPASTE  Please do not use if you have an allergy to CHG or antibacterial soaps. If your skin becomes reddened/irritated stop using the CHG.  Do not shave (including legs and underarms) for at least 48 hours prior to first CHG shower. It is OK to shave your face.  Please follow these instructions carefully.   Shower the NIGHT BEFORE SURGERY and the MORNING OF SURGERY with CHG.   If you chose to wash your hair, wash your hair first as usual with your normal shampoo.  After you shampoo, wash your face and private area with the soap you use at home, then rinse your hair and body thoroughly to remove the shampoo and soap.  Use CHG as you would any other liquid soap. You can apply CHG directly to the skin and wash gently with a scrungie or a clean washcloth.   Apply the CHG Soap to your body ONLY FROM THE NECK DOWN.  Do not use on open wounds or open sores. Avoid contact with your eyes, ears, mouth and genitals (private parts).   Wash thoroughly, paying  special attention to the area where your surgery will be performed.  Thoroughly rinse your body with warm water from the neck down.  DO NOT shower/wash with your normal soap after using and rinsing off the CHG Soap.  Pat yourself dry with a CLEAN TOWEL.  Wear CLEAN PAJAMAS to bed the night before surgery, wear comfortable clothes the morning of surgery  Place CLEAN SHEETS on your bed the night of your first shower and DO NOT SLEEP WITH PETS.  Day of Surgery: Shower as instructed above. Do not apply any deodorants/lotions, powders or colognes.  Please wear clean clothes to the hospital/surgery center.   Remember to brush your teeth WITH YOUR REGULAR TOOTHPASTE.  Do not wear jewelry, make-up or nail polish.  Do  not shave 48 hours prior to surgery.  Men may shave face and neck.  Do not bring valuables to the hospital.  [facility] is not responsible for any belongings or valuables.  Contacts, dentures or bridgework may not be worn into surgery.  Leave your suitcase in the car.  After surgery it may be brought to your room.  For patients admitted to the hospital, discharge time will be determined by your treatment team.  Patients discharged the day of surgery will not be allowed to drive home.   Please read over the fact sheets that you were given.

## 2021-03-11 ENCOUNTER — Other Ambulatory Visit: Payer: Self-pay

## 2021-03-11 ENCOUNTER — Encounter (HOSPITAL_COMMUNITY)
Admission: RE | Admit: 2021-03-11 | Discharge: 2021-03-11 | Disposition: A | Discharge status: Home / Self Care | Payer: Medicare Other | Source: Ambulatory Visit | Attending: Neurosurgery | Admitting: Neurosurgery

## 2021-03-11 ENCOUNTER — Encounter (HOSPITAL_COMMUNITY): Payer: Self-pay

## 2021-03-11 DIAGNOSIS — Z01812 Encounter for preprocedural laboratory examination: Secondary | ICD-10-CM | POA: Insufficient documentation

## 2021-03-11 DIAGNOSIS — Z20822 Contact with and (suspected) exposure to covid-19: Secondary | ICD-10-CM | POA: Diagnosis not present

## 2021-03-11 HISTORY — DX: Prediabetes: R73.03

## 2021-03-11 HISTORY — DX: Polyneuropathy, unspecified: G62.9

## 2021-03-11 LAB — SARS CORONAVIRUS 2 (TAT 6-24 HRS): SARS Coronavirus 2: NEGATIVE

## 2021-03-11 LAB — SURGICAL PCR SCREEN
MRSA, PCR: NEGATIVE
Staphylococcus aureus: NEGATIVE

## 2021-03-11 NOTE — Progress Notes (Addendum)
PCP - Dr. Eber Hong,  [place], [place]  Cardiologist - Dr. Domenic Polite  EP-no  [place]  Pulm-no  Chest x-ray - 01/01/21- 1 view   EKG - 01/01/21  Stress Test - 11/11/19  ECHO - 01/16/21  Cardiac Cath - 12/07/20  AICD-no PM-no LOOP-no  Sleep Study - no CPAP - no  LABS-patient had CBC , CMP on 02/28/21. PCR, Covid test 03/11/21  ASA- stopped 03/03/21  ERAS- yes  HA1C-01/24/21 -6.1 Fasting Blood Sugar - no Checks Blood Sugar ___none__ times a day  Anesthesia-  Pt denies having chest pain, sob, or fever at this time. All instructions explained to the pt, with a verbal understanding of the material. Pt agrees to go over the instructions while at home for a better understanding. Pt also instructed to self quarantine after being tested for COVID-19. The opportunity to ask questions was provided.

## 2021-03-12 NOTE — Anesthesia Preprocedure Evaluation (Addendum)
Anesthesia Evaluation  Patient identified by MRN, date of birth, ID band Patient awake    Reviewed: Allergy & Precautions, NPO status , Patient's Chart, lab work & pertinent test results  Airway Mallampati: III  TM Distance: <3 FB Neck ROM: Full   Comment: ANTERIOR ! Dental no notable dental hx. (+) Teeth Intact, Dental Advisory Given   Pulmonary neg pulmonary ROS,    Pulmonary exam normal breath sounds clear to auscultation       Cardiovascular hypertension, Pt. on medications + angina + CAD and + DOE  Normal cardiovascular exam Rhythm:Regular Rate:Normal     Neuro/Psych negative neurological ROS  negative psych ROS   GI/Hepatic Neg liver ROS, GERD  Medicated,  Endo/Other  negative endocrine ROS  Renal/GU negative Renal ROS  negative genitourinary   Musculoskeletal negative musculoskeletal ROS (+)   Abdominal   Peds negative pediatric ROS (+)  Hematology negative hematology ROS (+)   Anesthesia Other Findings   Reproductive/Obstetrics negative OB ROS                           Anesthesia Physical Anesthesia Plan  ASA: 3  Anesthesia Plan: General   Post-op Pain Management:    Induction: Intravenous  PONV Risk Score and Plan: 2 and Ondansetron and Dexamethasone  Airway Management Planned: Oral ETT  Additional Equipment: None  Intra-op Plan:   Post-operative Plan: Extubation in OR  Informed Consent:   Plan Discussed with: Anesthesiologist  Anesthesia Plan Comments: (PAT note by Karoline Caldwell, PA-C: Follows with cardiologist Dr. 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  Recently seen at [facility], ED 01/01/2021 with shortness of breath. ECG showed no acute ST segment changes at that time. Chest CTA was negative for pulmonary embolus as well as aortic dissection. Troponin I levels were normal.  MI was ruled out.   He was discharged for outpatient follow-up.  He did subsequently follow-up with Dr. Domenic Polite on 01/14/2021.  Per note, "Shortness of breath as discussed above. Recent ER work-up and testing reviewed. No clear evidence of ACS, ECG nonspecific. No pulmonary embolus by chest CT, anterior mediastinal lymph nodes enlarged. No cough, fevers or chills. Cardiac work-up from last year found nonobstructive CAD. We will plan a follow-up echocardiogram to ensure no decrease in LVEF."  Echo 01/16/2021 showed normal LVEF 55 to 60%, no significant valvular abnormalities.  Dr. Domenic Polite commented, "unless his symptoms worsen, we can likely go back to routine follow-up in 6 months."  Follows with oncology at [facility] for history of metastatic hormone sensitive prostate cancer.  He is currently maintained on Lupron.  His last seen 02/28/2021 and had BMP and CBC drawn at that time.  Results are in [place], reviewed, unremarkable.  EKG 01/02/2020: Normal sinus rhythm.  Rate 64. Minimal voltage criteria for LVH, may be normal variant ( R in aVL ). Possible Anterolateral infarct , age undetermined  TTE 01/16/2021: 1. Left ventricular ejection fraction, by estimation, is 55 to 60%. The  left ventricle has normal function. The left ventricle has no regional  wall motion abnormalities. There is mild asymmetric left ventricular  hypertrophy of the septal segment. Left  ventricular diastolic parameters are indeterminate.  2. Right ventricular systolic function is normal. The right ventricular  size is normal. Tricuspid regurgitation signal is inadequate for assessing  PA pressure.  3. The mitral valve is degenerative. Trivial mitral valve regurgitation.  4. The aortic valve  is tricuspid. Aortic valve regurgitation is trivial.  Mild to moderate aortic valve sclerosis/calcification is present, without  any evidence of aortic stenosis. Aortic regurgitation PHT measures 1175  msec.  5. Unable to estimate CVP.    Comparison(s): Echocardiogram done 11/11/19 showed an EF of 50-55%.   Cardiac catheterization 12/08/2019: . The left ventricular systolic function is normal. . The left ventricular ejection fraction is 50-55% by visual estimate. . LV end diastolic pressure is moderately elevated. . There is no aortic valve stenosis. . Prox LAD lesion is 55% stenosed with 60% stenosed side branch in 1st Diag. Both lesions were tested with RFR/DFR and found to be not physiologically significant. Jorene Minors LAD lesion is 60% stenosed.  . Angiographically moderate-severe single-vessel disease with ostial LAD eccentrically calcified 55-60% stenosis at takeoff of high branching 1st Diag -> RFR/DFR negative for both LAD and Diag; mid LAD focal 60% and hinge point . Otherwise minimal CAD in a right dominant system. . Normal LVEF and mild-moderately elevated EDP.  Angiographic imaging correlates with noninvasive nuclear stress test imaging. ->No obvious culprit lesion. Despite having lesions in the LAD, there was no evidence of anterior ischemia on the Myoview, this correlates with DFR/FFR results.  )      Anesthesia Quick Evaluation

## 2021-03-12 NOTE — Progress Notes (Signed)
Anesthesia Chart Review:  Follows with cardiologist Dr. 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  Recently seen at [facility], ED 01/01/2021 with shortness of breath. ECG showed no acute ST segment changes at that time.  Chest CTA was negative for pulmonary embolus as well as aortic dissection. Troponin I levels were normal.  MI was ruled out.  He was discharged for outpatient follow-up.  He did subsequently follow-up with Dr. Domenic Polite on 01/14/2021.  Per note, " Shortness of breath as discussed above.  Recent ER work-up and testing reviewed.  No clear evidence of ACS, ECG nonspecific.  No pulmonary embolus by chest CT, anterior mediastinal lymph nodes enlarged.  No cough, fevers or chills.  Cardiac work-up from last year found nonobstructive CAD.  We will plan a follow-up echocardiogram to ensure no decrease in LVEF."  Echo 01/16/2021 showed normal LVEF 55 to 60%, no significant valvular abnormalities.  Dr. Domenic Polite commented, "unless his symptoms worsen, we can likely go back to routine follow-up in 6 months."  Follows with oncology at [facility] for history of metastatic hormone sensitive prostate cancer.  He is currently maintained on Lupron.  His last seen 02/28/2021 and had BMP and CBC drawn at that time.  Results are in [place], reviewed, unremarkable.  EKG 01/02/2020: Normal sinus rhythm.  Rate 64. Minimal voltage criteria for LVH, may be normal variant ( R in aVL ). Possible Anterolateral infarct , age undetermined  TTE 01/16/2021:  1. Left ventricular ejection fraction, by estimation, is 55 to 60%. The  left ventricle has normal function. The left ventricle has no regional  wall motion abnormalities. There is mild asymmetric left ventricular  hypertrophy of the septal segment. Left  ventricular diastolic parameters are indeterminate.   2. Right ventricular systolic function is normal. The right ventricular  size is normal. Tricuspid  regurgitation signal is inadequate for assessing  PA pressure.   3. The mitral valve is degenerative. Trivial mitral valve regurgitation.   4. The aortic valve is tricuspid. Aortic valve regurgitation is trivial.  Mild to moderate aortic valve sclerosis/calcification is present, without  any evidence of aortic stenosis. Aortic regurgitation PHT measures 1175  msec.   5. Unable to estimate CVP.   Comparison(s): Echocardiogram done 11/11/19 showed an EF of 50-55%.   Cardiac catheterization 12/08/2019: The left ventricular systolic function is normal. The left ventricular ejection fraction is 50-55% by visual estimate. LV end diastolic pressure is moderately elevated. There is no aortic valve stenosis. Prox LAD lesion is 55% stenosed with 60% stenosed side branch in 1st Diag. Both lesions were tested with RFR/DFR and found to be not physiologically significant. Dist LAD lesion is 60% stenosed.   Angiographically moderate-severe single-vessel disease with ostial LAD eccentrically calcified 55-60% stenosis at takeoff of high branching 1st Diag -> RFR/DFR negative for both LAD and Diag; mid LAD focal 60% and hinge point Otherwise minimal CAD in a right dominant system. Normal LVEF and mild-moderately elevated EDP.   Angiographic imaging correlates with noninvasive nuclear stress test imaging. ->  No obvious culprit lesion.  Despite having lesions in the LAD, there was no evidence of anterior ischemia on the Myoview, this correlates with DFR/FFR results.   Mitchell [name] [facility] Short Stay Center/Anesthesiology Phone [phone] 03/12/2021 2:18 PM'

## 2021-03-13 NOTE — Progress Notes (Signed)
Patient's wife answered home phone number, she voiced understanding of new arrival time of 65.

## 2021-03-14 ENCOUNTER — Encounter (HOSPITAL_COMMUNITY)
Admission: RE | Disposition: A | Discharge status: Home / Self Care | Payer: Self-pay | Source: Home / Self Care | Attending: Neurosurgery

## 2021-03-14 ENCOUNTER — Encounter (HOSPITAL_COMMUNITY): Payer: Self-pay | Admitting: Neurosurgery

## 2021-03-14 ENCOUNTER — Ambulatory Visit (HOSPITAL_COMMUNITY): Payer: Medicare Other | Admitting: Anesthesiology

## 2021-03-14 ENCOUNTER — Observation Stay (HOSPITAL_COMMUNITY)
Admission: RE | Admit: 2021-03-14 | Discharge: 2021-03-14 | Disposition: A | Discharge status: Home / Self Care | Payer: Medicare Other | Attending: Neurosurgery | Admitting: Neurosurgery

## 2021-03-14 ENCOUNTER — Ambulatory Visit (HOSPITAL_COMMUNITY): Payer: Medicare Other

## 2021-03-14 ENCOUNTER — Other Ambulatory Visit: Payer: Self-pay

## 2021-03-14 ENCOUNTER — Ambulatory Visit (HOSPITAL_COMMUNITY): Payer: Medicare Other | Admitting: Physician Assistant

## 2021-03-14 DIAGNOSIS — X58XXXD Exposure to other specified factors, subsequent encounter: Secondary | ICD-10-CM | POA: Diagnosis not present

## 2021-03-14 DIAGNOSIS — R030 Elevated blood-pressure reading, without diagnosis of hypertension: Secondary | ICD-10-CM | POA: Insufficient documentation

## 2021-03-14 DIAGNOSIS — G609 Hereditary and idiopathic neuropathy, unspecified: Secondary | ICD-10-CM | POA: Insufficient documentation

## 2021-03-14 DIAGNOSIS — M48061 Spinal stenosis, lumbar region without neurogenic claudication: Secondary | ICD-10-CM | POA: Diagnosis not present

## 2021-03-14 DIAGNOSIS — M4120 Other idiopathic scoliosis, site unspecified: Secondary | ICD-10-CM | POA: Diagnosis not present

## 2021-03-14 DIAGNOSIS — S22080G Wedge compression fracture of T11-T12 vertebra, subsequent encounter for fracture with delayed healing: Secondary | ICD-10-CM | POA: Diagnosis present

## 2021-03-14 DIAGNOSIS — M4316 Spondylolisthesis, lumbar region: Secondary | ICD-10-CM | POA: Diagnosis not present

## 2021-03-14 DIAGNOSIS — Z9889 Other specified postprocedural states: Secondary | ICD-10-CM | POA: Insufficient documentation

## 2021-03-14 DIAGNOSIS — G8929 Other chronic pain: Secondary | ICD-10-CM | POA: Insufficient documentation

## 2021-03-14 DIAGNOSIS — M4854XG Collapsed vertebra, not elsewhere classified, thoracic region, subsequent encounter for fracture with delayed healing: Principal | ICD-10-CM | POA: Insufficient documentation

## 2021-03-14 DIAGNOSIS — Z419 Encounter for procedure for purposes other than remedying health state, unspecified: Secondary | ICD-10-CM

## 2021-03-14 DIAGNOSIS — S22009A Unspecified fracture of unspecified thoracic vertebra, initial encounter for closed fracture: Secondary | ICD-10-CM | POA: Diagnosis present

## 2021-03-14 HISTORY — PX: KYPHOPLASTY: SHX5884

## 2021-03-14 SURGERY — KYPHOPLASTY
Anesthesia: General

## 2021-03-14 MED ORDER — DEXAMETHASONE SODIUM PHOSPHATE 4 MG/ML IJ SOLN: 4.0000 mg | Freq: Three times a day (TID) | INTRAMUSCULAR | Status: DC

## 2021-03-14 MED ORDER — FUROSEMIDE 20 MG PO TABS: 20.0000 mg | ORAL_TABLET | Freq: Every day | ORAL | Status: DC

## 2021-03-14 MED ORDER — ONDANSETRON HCL 4 MG/2ML IJ SOLN
INTRAMUSCULAR | Status: AC
  Filled 2021-03-14: qty 2

## 2021-03-14 MED ORDER — HYDROCODONE-ACETAMINOPHEN 5-325 MG PO TABS: 2.0000 | ORAL_TABLET | ORAL | Status: DC | PRN

## 2021-03-14 MED ORDER — IOPAMIDOL (ISOVUE-300) INJECTION 61%
INTRAVENOUS | Status: DC | PRN
  Administered 2021-03-14: 50 mL

## 2021-03-14 MED ORDER — ROCURONIUM BROMIDE 10 MG/ML (PF) SYRINGE
PREFILLED_SYRINGE | INTRAVENOUS | Status: DC | PRN
Start: 2021-03-14 — End: 2021-03-14
  Administered 2021-03-14: 50 mg via INTRAVENOUS

## 2021-03-14 MED ORDER — SUGAMMADEX SODIUM 200 MG/2ML IV SOLN
INTRAVENOUS | Status: DC | PRN
  Administered 2021-03-14: 200 mg via INTRAVENOUS

## 2021-03-14 MED ORDER — BISACODYL 10 MG RE SUPP: 10.0000 mg | Freq: Every day | RECTAL | Status: DC | PRN

## 2021-03-14 MED ORDER — DOCUSATE SODIUM 100 MG PO CAPS: 100.0000 mg | ORAL_CAPSULE | Freq: Two times a day (BID) | ORAL | Status: DC

## 2021-03-14 MED ORDER — ALBUTEROL SULFATE (2.5 MG/3ML) 0.083% IN NEBU
2.5000 mg | INHALATION_SOLUTION | Freq: Four times a day (QID) | RESPIRATORY_TRACT | Status: DC | PRN
Start: 2021-03-14 — End: 2021-03-14

## 2021-03-14 MED ORDER — SODIUM CHLORIDE 0.9% FLUSH: 3.0000 mL | INTRAVENOUS | Status: DC | PRN

## 2021-03-14 MED ORDER — FENTANYL CITRATE (PF) 250 MCG/5ML IJ SOLN
INTRAMUSCULAR | Status: DC | PRN
  Administered 2021-03-14: 100 ug via INTRAVENOUS

## 2021-03-14 MED ORDER — 0.9 % SODIUM CHLORIDE (POUR BTL) OPTIME
TOPICAL | Status: DC | PRN
  Administered 2021-03-14: 1000 mL

## 2021-03-14 MED ORDER — OXYCODONE HCL 5 MG PO TABS: 5.0000 mg | ORAL_TABLET | Freq: Once | ORAL | Status: DC | PRN

## 2021-03-14 MED ORDER — LIDOCAINE-EPINEPHRINE 1 %-1:100000 IJ SOLN
INTRAMUSCULAR | Status: AC
  Filled 2021-03-14: qty 1

## 2021-03-14 MED ORDER — EPHEDRINE SULFATE-NACL 50-0.9 MG/10ML-% IV SOSY
PREFILLED_SYRINGE | INTRAVENOUS | Status: DC | PRN
  Administered 2021-03-14 (×2): 5 mg via INTRAVENOUS

## 2021-03-14 MED ORDER — DEXAMETHASONE SODIUM PHOSPHATE 10 MG/ML IJ SOLN
INTRAMUSCULAR | Status: AC
  Filled 2021-03-14: qty 1

## 2021-03-14 MED ORDER — PROPYLENE GLYCOL 0.6 % OP SOLN: 1.0000 [drp] | Freq: Every day | OPHTHALMIC | Status: DC | PRN

## 2021-03-14 MED ORDER — DEXAMETHASONE 4 MG PO TABS: 2.0000 mg | ORAL_TABLET | Freq: Three times a day (TID) | ORAL | Status: DC

## 2021-03-14 MED ORDER — TRAMADOL HCL 50 MG PO TABS: 50.0000 mg | ORAL_TABLET | Freq: Three times a day (TID) | ORAL | Status: DC | PRN

## 2021-03-14 MED ORDER — ALBUTEROL SULFATE HFA 108 (90 BASE) MCG/ACT IN AERS: 1.0000 | INHALATION_SPRAY | Freq: Four times a day (QID) | RESPIRATORY_TRACT | Status: DC | PRN

## 2021-03-14 MED ORDER — SODIUM CHLORIDE 0.9 % IV SOLN
250.0000 mL | INTRAVENOUS | Status: DC
  Administered 2021-03-14: 250 mL via INTRAVENOUS

## 2021-03-14 MED ORDER — OXYCODONE HCL 5 MG PO TABS: 5.0000 mg | ORAL_TABLET | ORAL | Status: DC | PRN

## 2021-03-14 MED ORDER — BUPIVACAINE HCL (PF) 0.5 % IJ SOLN
INTRAMUSCULAR | Status: AC
  Filled 2021-03-14: qty 30

## 2021-03-14 MED ORDER — LACTATED RINGERS IV SOLN: INTRAVENOUS | Status: DC

## 2021-03-14 MED ORDER — ACETAMINOPHEN 325 MG PO TABS: 650.0000 mg | ORAL_TABLET | ORAL | Status: DC | PRN

## 2021-03-14 MED ORDER — METHOCARBAMOL 500 MG PO TABS: 500.0000 mg | ORAL_TABLET | Freq: Four times a day (QID) | ORAL | Status: DC | PRN

## 2021-03-14 MED ORDER — SODIUM CHLORIDE 0.9% FLUSH: 3.0000 mL | Freq: Two times a day (BID) | INTRAVENOUS | Status: DC

## 2021-03-14 MED ORDER — FENTANYL CITRATE (PF) 250 MCG/5ML IJ SOLN
INTRAMUSCULAR | Status: AC
  Filled 2021-03-14: qty 5

## 2021-03-14 MED ORDER — ACETAMINOPHEN 500 MG PO TABS: 1000.0000 mg | ORAL_TABLET | Freq: Four times a day (QID) | ORAL | Status: DC | PRN

## 2021-03-14 MED ORDER — IBUPROFEN 200 MG PO TABS: 600.0000 mg | ORAL_TABLET | Freq: Four times a day (QID) | ORAL | Status: DC | PRN

## 2021-03-14 MED ORDER — POLYETHYLENE GLYCOL 3350 17 G PO PACK: 17.0000 g | PACK | Freq: Every day | ORAL | Status: DC | PRN

## 2021-03-14 MED ORDER — ALLOPURINOL 100 MG PO TABS: 200.0000 mg | ORAL_TABLET | Freq: Every day | ORAL | Status: DC

## 2021-03-14 MED ORDER — AMLODIPINE BESYLATE 5 MG PO TABS: 2.5000 mg | ORAL_TABLET | Freq: Every day | ORAL | Status: DC

## 2021-03-14 MED ORDER — ONDANSETRON HCL 4 MG PO TABS: 4.0000 mg | ORAL_TABLET | Freq: Four times a day (QID) | ORAL | Status: DC | PRN

## 2021-03-14 MED ORDER — CHLORHEXIDINE GLUCONATE CLOTH 2 % EX PADS: 6.0000 | MEDICATED_PAD | Freq: Once | CUTANEOUS | Status: DC

## 2021-03-14 MED ORDER — PREDNISONE 5 MG PO TABS
5.0000 mg | ORAL_TABLET | Freq: Every day | ORAL | Status: DC
  Filled 2021-03-14: qty 1

## 2021-03-14 MED ORDER — BUPIVACAINE HCL (PF) 0.5 % IJ SOLN
INTRAMUSCULAR | Status: DC | PRN
  Administered 2021-03-14: 3 mL

## 2021-03-14 MED ORDER — PANTOPRAZOLE SODIUM 40 MG IV SOLR: 40.0000 mg | Freq: Every day | INTRAVENOUS | Status: DC

## 2021-03-14 MED ORDER — ZOLPIDEM TARTRATE 5 MG PO TABS: 5.0000 mg | ORAL_TABLET | Freq: Every evening | ORAL | Status: DC | PRN

## 2021-03-14 MED ORDER — LIDOCAINE 2% (20 MG/ML) 5 ML SYRINGE
INTRAMUSCULAR | Status: DC | PRN
  Administered 2021-03-14: 100 mg via INTRAVENOUS

## 2021-03-14 MED ORDER — DEXAMETHASONE SODIUM PHOSPHATE 4 MG/ML IJ SOLN
2.0000 mg | Freq: Three times a day (TID) | INTRAMUSCULAR | Status: DC
  Administered 2021-03-14: 2 mg via INTRAVENOUS
  Filled 2021-03-14: qty 1

## 2021-03-14 MED ORDER — FLEET ENEMA 7-19 GM/118ML RE ENEM: 1.0000 | ENEMA | Freq: Once | RECTAL | Status: DC | PRN

## 2021-03-14 MED ORDER — BISOPROLOL FUMARATE 5 MG PO TABS: 2.5000 mg | ORAL_TABLET | Freq: Every day | ORAL | Status: DC

## 2021-03-14 MED ORDER — CHLORHEXIDINE GLUCONATE 0.12 % MT SOLN
15.0000 mL | Freq: Once | OROMUCOSAL | Status: AC
  Administered 2021-03-14: 15 mL via OROMUCOSAL
  Filled 2021-03-14: qty 15

## 2021-03-14 MED ORDER — LIDOCAINE 2% (20 MG/ML) 5 ML SYRINGE
INTRAMUSCULAR | Status: AC
  Filled 2021-03-14: qty 5

## 2021-03-14 MED ORDER — ROCURONIUM BROMIDE 10 MG/ML (PF) SYRINGE
PREFILLED_SYRINGE | INTRAVENOUS | Status: AC
  Filled 2021-03-14: qty 10

## 2021-03-14 MED ORDER — ACETAMINOPHEN 650 MG RE SUPP: 650.0000 mg | RECTAL | Status: DC | PRN

## 2021-03-14 MED ORDER — HYDROCORTISONE 2.5 % EX CREA: 1.0000 "application " | TOPICAL_CREAM | Freq: Two times a day (BID) | CUTANEOUS | Status: DC | PRN

## 2021-03-14 MED ORDER — FENTANYL CITRATE (PF) 100 MCG/2ML IJ SOLN: 25.0000 ug | INTRAMUSCULAR | Status: DC | PRN

## 2021-03-14 MED ORDER — ADULT MULTIVITAMIN W/MINERALS CH: 1.0000 | ORAL_TABLET | Freq: Every day | ORAL | Status: DC

## 2021-03-14 MED ORDER — DEXAMETHASONE SODIUM PHOSPHATE 10 MG/ML IJ SOLN
INTRAMUSCULAR | Status: DC | PRN
  Administered 2021-03-14: 10 mg via INTRAVENOUS

## 2021-03-14 MED ORDER — VANCOMYCIN HCL 500 MG/100ML IV SOLN
500.0000 mg | Freq: Once | INTRAVENOUS | Status: DC
  Filled 2021-03-14: qty 100

## 2021-03-14 MED ORDER — MEPERIDINE HCL 25 MG/ML IJ SOLN: 6.2500 mg | INTRAMUSCULAR | Status: DC | PRN

## 2021-03-14 MED ORDER — ORAL CARE MOUTH RINSE: 15.0000 mL | Freq: Once | OROMUCOSAL | Status: AC

## 2021-03-14 MED ORDER — ACETAMINOPHEN 325 MG PO TABS: 325.0000 mg | ORAL_TABLET | ORAL | Status: DC | PRN

## 2021-03-14 MED ORDER — GABAPENTIN 300 MG PO CAPS
600.0000 mg | ORAL_CAPSULE | Freq: Three times a day (TID) | ORAL | Status: DC
  Administered 2021-03-14: 600 mg via ORAL
  Filled 2021-03-14: qty 2

## 2021-03-14 MED ORDER — ALUM & MAG HYDROXIDE-SIMETH 200-200-20 MG/5ML PO SUSP: 30.0000 mL | Freq: Four times a day (QID) | ORAL | Status: DC | PRN

## 2021-03-14 MED ORDER — HYDROCHLOROTHIAZIDE 25 MG PO TABS: 25.0000 mg | ORAL_TABLET | Freq: Two times a day (BID) | ORAL | Status: DC

## 2021-03-14 MED ORDER — ONDANSETRON HCL 4 MG/2ML IJ SOLN
INTRAMUSCULAR | Status: DC | PRN
  Administered 2021-03-14: 4 mg via INTRAVENOUS

## 2021-03-14 MED ORDER — PROPOFOL 10 MG/ML IV BOLUS
INTRAVENOUS | Status: DC | PRN
  Administered 2021-03-14: 100 mg via INTRAVENOUS

## 2021-03-14 MED ORDER — VANCOMYCIN HCL IN DEXTROSE 1-5 GM/200ML-% IV SOLN
1000.0000 mg | INTRAVENOUS | Status: AC
  Administered 2021-03-14: 1000 mg via INTRAVENOUS
  Filled 2021-03-14: qty 200

## 2021-03-14 MED ORDER — EPHEDRINE 5 MG/ML INJ
INTRAVENOUS | Status: AC
  Filled 2021-03-14: qty 5

## 2021-03-14 MED ORDER — ONDANSETRON HCL 4 MG/2ML IJ SOLN: 4.0000 mg | Freq: Four times a day (QID) | INTRAMUSCULAR | Status: DC | PRN

## 2021-03-14 MED ORDER — MECLIZINE HCL 25 MG PO TABS
25.0000 mg | ORAL_TABLET | Freq: Three times a day (TID) | ORAL | Status: DC | PRN
  Filled 2021-03-14: qty 1

## 2021-03-14 MED ORDER — PHENOL 1.4 % MT LIQD: 1.0000 | OROMUCOSAL | Status: DC | PRN

## 2021-03-14 MED ORDER — HYDROMORPHONE HCL 1 MG/ML IJ SOLN: 0.5000 mg | INTRAMUSCULAR | Status: DC | PRN

## 2021-03-14 MED ORDER — PROPOFOL 10 MG/ML IV BOLUS
INTRAVENOUS | Status: AC
  Filled 2021-03-14: qty 20

## 2021-03-14 MED ORDER — ATORVASTATIN CALCIUM 10 MG PO TABS: 10.0000 mg | ORAL_TABLET | Freq: Every day | ORAL | Status: DC

## 2021-03-14 MED ORDER — ACETAMINOPHEN 160 MG/5ML PO SOLN: 325.0000 mg | ORAL | Status: DC | PRN

## 2021-03-14 MED ORDER — THROMBIN 20000 UNITS EX SOLR
CUTANEOUS | Status: AC
  Filled 2021-03-14: qty 20000

## 2021-03-14 MED ORDER — FLUTICASONE PROPIONATE 50 MCG/ACT NA SUSP
2.0000 | Freq: Every day | NASAL | Status: DC | PRN
  Filled 2021-03-14: qty 16

## 2021-03-14 MED ORDER — ONDANSETRON HCL 4 MG/2ML IJ SOLN
4.0000 mg | Freq: Once | INTRAMUSCULAR | Status: AC | PRN
  Administered 2021-03-14: 4 mg via INTRAVENOUS

## 2021-03-14 MED ORDER — LIDOCAINE-EPINEPHRINE 1 %-1:100000 IJ SOLN
INTRAMUSCULAR | Status: DC | PRN
  Administered 2021-03-14: 3 mL

## 2021-03-14 MED ORDER — VANCOMYCIN HCL 500 MG/100ML IV SOLN: 500.0000 mg | Freq: Once | INTRAVENOUS | Status: DC

## 2021-03-14 MED ORDER — COLCHICINE 0.6 MG PO TABS
0.6000 mg | ORAL_TABLET | Freq: Two times a day (BID) | ORAL | Status: DC | PRN
  Filled 2021-03-14: qty 1

## 2021-03-14 MED ORDER — METHOCARBAMOL 1000 MG/10ML IJ SOLN
500.0000 mg | Freq: Four times a day (QID) | INTRAVENOUS | Status: DC | PRN
  Filled 2021-03-14: qty 5

## 2021-03-14 MED ORDER — OXYCODONE HCL 5 MG/5ML PO SOLN: 5.0000 mg | Freq: Once | ORAL | Status: DC | PRN

## 2021-03-14 MED ORDER — ASPIRIN EC 81 MG PO TBEC: 81.0000 mg | DELAYED_RELEASE_TABLET | Freq: Every day | ORAL | Status: DC

## 2021-03-14 MED ORDER — MENTHOL 3 MG MT LOZG: 1.0000 | LOZENGE | OROMUCOSAL | Status: DC | PRN

## 2021-03-14 MED ORDER — KCL IN DEXTROSE-NACL 20-5-0.45 MEQ/L-%-% IV SOLN: INTRAVENOUS | Status: DC

## 2021-03-14 SURGICAL SUPPLY — 42 items
BAG COUNTER SPONGE SURGICOUNT (BAG) ×2 IMPLANT
BLADE CLIPPER SURG (BLADE) IMPLANT
BLADE SURG 15 STRL LF DISP TIS (BLADE) ×1 IMPLANT
BLADE SURG 15 STRL SS (BLADE) ×1
CARTRIDGE OIL MAESTRO DRILL (MISCELLANEOUS) IMPLANT
CEMENT KYPHON CX01A KIT/MIXER (Cement) ×2 IMPLANT
DECANTER SPIKE VIAL GLASS SM (MISCELLANEOUS) ×4 IMPLANT
DERMABOND ADVANCED (GAUZE/BANDAGES/DRESSINGS) ×1
DERMABOND ADVANCED .7 DNX12 (GAUZE/BANDAGES/DRESSINGS) ×1 IMPLANT
DIFFUSER DRILL AIR PNEUMATIC (MISCELLANEOUS) IMPLANT
DRAPE C-ARM 42X72 X-RAY (DRAPES) ×2 IMPLANT
DRAPE HALF SHEET 40X57 (DRAPES) IMPLANT
DRAPE LAPAROTOMY 100X72X124 (DRAPES) ×2 IMPLANT
DRAPE SURG 17X23 STRL (DRAPES) ×2 IMPLANT
DRAPE WARM FLUID 44X44 (DRAPES) IMPLANT
DURAPREP 26ML APPLICATOR (WOUND CARE) ×2 IMPLANT
GAUZE 4X4 16PLY ~~LOC~~+RFID DBL (SPONGE) ×2 IMPLANT
GLOVE EXAM NITRILE XL STR (GLOVE) IMPLANT
GLOVE SURG ENC MOIS LTX SZ8 (GLOVE) ×2 IMPLANT
GLOVE SURG UNDER POLY LF SZ6.5 (GLOVE) ×4 IMPLANT
GLOVE SURG UNDER POLY LF SZ7 (GLOVE) ×4 IMPLANT
GLOVE SURG UNDER POLY LF SZ7.5 (GLOVE) ×2 IMPLANT
GLOVE SURG UNDER POLY LF SZ8.5 (GLOVE) ×2 IMPLANT
GOWN STRL REUS W/ TWL LRG LVL3 (GOWN DISPOSABLE) ×2 IMPLANT
GOWN STRL REUS W/ TWL XL LVL3 (GOWN DISPOSABLE) ×2 IMPLANT
GOWN STRL REUS W/TWL 2XL LVL3 (GOWN DISPOSABLE) IMPLANT
GOWN STRL REUS W/TWL LRG LVL3 (GOWN DISPOSABLE) ×2
GOWN STRL REUS W/TWL XL LVL3 (GOWN DISPOSABLE) ×2
KIT BASIN OR (CUSTOM PROCEDURE TRAY) ×2 IMPLANT
KIT TURNOVER KIT B (KITS) ×2 IMPLANT
NEEDLE HYPO 25X1 1.5 SAFETY (NEEDLE) ×2 IMPLANT
NS IRRIG 1000ML POUR BTL (IV SOLUTION) ×2 IMPLANT
OIL CARTRIDGE MAESTRO DRILL (MISCELLANEOUS)
PACK SURGICAL SETUP 50X90 (CUSTOM PROCEDURE TRAY) ×2 IMPLANT
PAD ARMBOARD 7.5X6 YLW CONV (MISCELLANEOUS) ×6 IMPLANT
STAPLER SKIN PROX WIDE 3.9 (STAPLE) ×2 IMPLANT
SUT VIC AB 3-0 SH 8-18 (SUTURE) ×2 IMPLANT
SYR CONTROL 10ML LL (SYRINGE) ×4 IMPLANT
TOWEL GREEN STERILE (TOWEL DISPOSABLE) ×2 IMPLANT
TOWEL GREEN STERILE FF (TOWEL DISPOSABLE) ×2 IMPLANT
TRAY KYPHOPAK 15/2 EXPRESS CDS (KITS) ×2 IMPLANT
TRAY KYPHOPAK 20/3 ONESTEP 1ST (MISCELLANEOUS) IMPLANT

## 2021-03-14 NOTE — TOC Transition Note (Signed)
Transition of Care [facility]) - CM/SW Discharge Note   Patient Details  Name: Mitchell [name] MRN: [id number] Date of Birth: 09-Aug-1934  Transition of Care [facility]) CM/SW Contact:  Verdell Carmine, RN Phone Number: 03/14/2021, 5:38 PM   Clinical Narrative:     PT OT evaluations post surgical reveal no need for [facility] or DME, does and will need supervision post operatively. CM will sign off., patient will DC today  Final next level of care: Home/Self Care Barriers to Discharge: No Barriers Identified   Patient Goals and CMS Choice        Discharge Placement                       Discharge Plan and Services                                     Social Determinants of Health (SDOH) Interventions     Readmission Risk Interventions No flowsheet data found.

## 2021-03-14 NOTE — Plan of Care (Signed)
Adequately Ready for Discharged

## 2021-03-14 NOTE — Evaluation (Signed)
Physical Therapy Evaluation Patient Details Name: Mitchell [name] MRN: [id number] DOB: 1934/05/09 Today's Date: 03/14/2021   History of Present Illness  Pt is an 87 y/o male s/p T12 kyphoplasty. PMH includes HTN and CAD.  Clinical Impression  Patient is s/p above surgery resulting in the deficits listed below (see PT Problem List). Pt requiring min guard A for mobility tasks using RW this session. Educated about using RW at home for increased safety. Reviewed back precautions, walking program, and car transfer technique. Reports wife can assist if needed at d/c. Patient will benefit from skilled PT to increase their independence and safety with mobility (while adhering to their precautions) to allow discharge to the venue listed below.     Follow Up Recommendations No PT follow up;Supervision for mobility/OOB (would benefit from outpatient PT once cleared by MD)    Equipment Recommendations  None recommended by PT    Recommendations for Other Services       Precautions / Restrictions Precautions Precautions: Back Precaution Booklet Issued: Yes (comment) Precaution Comments: Reviewed back precautions with pt. Restrictions Weight Bearing Restrictions: No      Mobility  Bed Mobility Overal bed mobility: Needs Assistance Bed Mobility: Rolling;Sidelying to Sit;Sit to Sidelying Rolling: Min guard Sidelying to sit: Min guard     Sit to sidelying: Min assist General bed mobility comments: Required min guard for safety to come to sitting on EOB. Cues for log roll technique. Min A for LE assist for return to sidelying.    Transfers Overall transfer level: Needs assistance Equipment used: Rolling walker (2 wheeled) Transfers: Sit to/from Stand Sit to Stand: Min guard         General transfer comment: Min guard for safety. Required 2 attempts to stand.  Ambulation/Gait Ambulation/Gait assistance: Min guard Gait Distance (Feet): 100 Feet Assistive device: Rolling walker (2  wheeled) Gait Pattern/deviations: Step-through pattern;Decreased stride length;Trunk flexed Gait velocity: Decreased   General Gait Details: Slow, guarded gait. No overt LOB noted. Min guard for safety. Educated about using RW at home for increased safety. Educated about walking program to perform at home.  Stairs            Wheelchair Mobility    Modified Rankin (Stroke Patients Only)       Balance Overall balance assessment: Needs assistance Sitting-balance support: No upper extremity supported;Feet supported Sitting balance-Leahy Scale: Good     Standing balance support: During functional activity;Bilateral upper extremity supported Standing balance-Leahy Scale: Poor Standing balance comment: Reliant on UE support                             Pertinent Vitals/Pain Pain Assessment: Faces Faces Pain Scale: Hurts little more Pain Location: back and R side Pain Descriptors / Indicators: Aching;Operative site guarding Pain Intervention(s): Limited activity within patient's tolerance;Monitored during session;Repositioned    Home Living Family/patient expects to be discharged to:: Private residence Living Arrangements: Spouse/significant other Available Help at Discharge: Family;Available 24 hours/day Type of Home: House Home Access: Stairs to enter Entrance Stairs-Rails: None Entrance Stairs-Number of Steps: 1-2 Home Layout: One level Home Equipment: Walker - 2 wheels;Bedside commode;Cane - single point      Prior Function Level of Independence: Independent with assistive device(s)         Comments: reports occasional use of cane     Hand Dominance        Extremity/Trunk Assessment   Upper Extremity Assessment Upper Extremity Assessment: Defer to  OT evaluation    Lower Extremity Assessment Lower Extremity Assessment: Generalized weakness;LLE deficits/detail LLE Deficits / Details: Reported L side into hip was hurting    Cervical /  Trunk Assessment Cervical / Trunk Assessment: Other exceptions Cervical / Trunk Exceptions: s/p kyphoplasty  Communication   Communication: No difficulties  Cognition Arousal/Alertness: Awake/alert Behavior During Therapy: WFL for tasks assessed/performed Overall Cognitive Status: Within Functional Limits for tasks assessed                                        General Comments General comments (skin integrity, edema, etc.): Pt's wife and daughter present    Exercises     Assessment/Plan    PT Assessment Patient needs continued PT services  PT Problem List Decreased strength;Decreased balance;Decreased mobility;Decreased knowledge of use of DME;Decreased knowledge of precautions;Pain       PT Treatment Interventions DME instruction;Gait training;Stair training;Functional mobility training;Therapeutic activities;Therapeutic exercise;Balance training;Patient/family education    PT Goals (Current goals can be found in the Care Plan section)  Acute Rehab PT Goals Patient Stated Goal: to go home PT Goal Formulation: With patient Time For Goal Achievement: 03/28/21 Potential to Achieve Goals: Good    Frequency Min 5X/week   Barriers to discharge        Co-evaluation               AM-PAC PT "6 Clicks" Mobility  Outcome Measure Help needed turning from your back to your side while in a flat bed without using bedrails?: A Little Help needed moving from lying on your back to sitting on the side of a flat bed without using bedrails?: A Little Help needed moving to and from a bed to a chair (including a wheelchair)?: A Little Help needed standing up from a chair using your arms (e.g., wheelchair or bedside chair)?: A Little Help needed to walk in hospital room?: A Little Help needed climbing 3-5 steps with a railing? : A Little 6 Click Score: 18    End of Session Equipment Utilized During Treatment: Gait belt Activity Tolerance: Patient tolerated  treatment well Patient left: in bed;with call bell/phone within reach;with family/visitor present Nurse Communication: Mobility status PT Visit Diagnosis: Muscle weakness (generalized) (M62.81);Unsteadiness on feet (R26.81)    Time: 1351-1410 PT Time Calculation (min) (ACUTE ONLY): 19 min   Charges:   PT Evaluation $PT Eval Low Complexity: 1 Low          Lou Miner, DPT  Acute Rehabilitation Services  Pager: [phone] Office: [phone]   Rudean Hitt 03/14/2021, 2:28 PM

## 2021-03-14 NOTE — Evaluation (Signed)
Occupational Therapy Evaluation Patient Details Name: Mitchell [name] MRN: [id number] DOB: Jan 12, 1934 Today's Date: 03/14/2021    History of Present Illness Pt is an 87 y/o male s/p T12 kyphoplasty. PMH includes HTN and CAD.   Clinical Impression   Patient admitted for the diagnosis and procedure above.  PTA he lives with his spouse, who is able to assist as needed. He endorses walking short household distances and occasional assist with ADL completion from spouse due to back pain.  Barrier is expected post surgical pain.  Currently he is needing minimal setup for ADL completion at sit/stand level.  Patient is expected to discharge today with assist as needed from his spouse.  No further OT needs identified for OT.  Spouse has a hip kit at home if needed.      Follow Up Recommendations  No OT follow up;Supervision - Intermittent    Equipment Recommendations  None recommended by OT    Recommendations for Other Services       Precautions / Restrictions Precautions Precautions: Fall Precaution Booklet Issued: Yes (comment) Precaution Comments: Reviewed back precautions with pt. Restrictions Weight Bearing Restrictions: No Other Position/Activity Restrictions: precautions to tolerance      Mobility Bed Mobility Overal bed mobility: Needs Assistance Bed Mobility: Rolling;Sidelying to Sit;Sit to Sidelying Rolling: Modified independent (Device/Increase time) Sidelying to sit: Modified independent (Device/Increase time)     Sit to sidelying: Min assist General bed mobility comments: assist to bring feet back up on the bed Patient Response: Cooperative  Transfers Overall transfer level: Needs assistance Equipment used: Rolling walker (2 wheeled) Transfers: Sit to/from Stand Sit to Stand: Supervision         General transfer comment: No AD needed    Balance Overall balance assessment: No apparent balance deficits (not formally assessed) Sitting-balance support: No upper  extremity supported;Feet supported Sitting balance-Leahy Scale: Good     Standing balance support: During functional activity;Bilateral upper extremity supported Standing balance-Leahy Scale: Poor Standing balance comment: Reliant on UE support                           ADL either performed or assessed with clinical judgement   ADL Overall ADL's : At baseline                                             Vision Baseline Vision/History: Wears glasses Patient Visual Report: No change from baseline       Perception     Praxis      Pertinent Vitals/Pain Pain Assessment: Faces Faces Pain Scale: Hurts a little bit Pain Location: back and R side Pain Descriptors / Indicators: Operative site guarding;Sore Pain Intervention(s): Monitored during session     Hand Dominance Right   Extremity/Trunk Assessment Upper Extremity Assessment Upper Extremity Assessment: Overall WFL for tasks assessed   Lower Extremity Assessment Lower Extremity Assessment: Generalized weakness;LLE deficits/detail LLE Deficits / Details: Reported L side into hip was hurting   Cervical / Trunk Assessment Cervical / Trunk Assessment: Other exceptions Cervical / Trunk Exceptions: s/p kyphoplasty   Communication Communication Communication: No difficulties   Cognition Arousal/Alertness: Awake/alert Behavior During Therapy: WFL for tasks assessed/performed Overall Cognitive Status: Within Functional Limits for tasks assessed  General Comments  Pt's wife and daughter present    Exercises     Shoulder Instructions      Home Living Family/patient expects to be discharged to:: Private residence Living Arrangements: Spouse/significant other Available Help at Discharge: Family;Available 24 hours/day Type of Home: House Home Access: Stairs to enter [employer] of Steps: 1-2 Entrance Stairs-Rails: None Home  Layout: One level     Bathroom Shower/Tub: Occupational psychologist: Handicapped height     Home Equipment: Environmental consultant - 2 wheels;Bedside commode;Cane - single point          Prior Functioning/Environment Level of Independence: Independent with assistive device(s)        Comments: Able to complete his own ADL/IADL.  Continues to drive, and walks with occasional use of a SPC        OT Problem List: Pain      OT Treatment/Interventions:      OT Goals(Current goals can be found in the care plan section) Acute Rehab OT Goals Patient Stated Goal: to go home OT Goal Formulation: With patient Time For Goal Achievement: 03/14/21 Potential to Achieve Goals: Good  OT Frequency:     Barriers to D/C:    none noted       Co-evaluation              AM-PAC OT "6 Clicks" Daily Activity     Outcome Measure Help from another person eating meals?: None Help from another person taking care of personal grooming?: None Help from another person toileting, which includes using toliet, bedpan, or urinal?: None Help from another person bathing (including washing, rinsing, drying)?: None Help from another person to put on and taking off regular upper body clothing?: None Help from another person to put on and taking off regular lower body clothing?: None 6 Click Score: 24   End of Session    Activity Tolerance: Patient tolerated treatment well Patient left: in bed;with call bell/phone within reach;with family/visitor present  OT Visit Diagnosis: Pain                Time: 3329-5188 OT Time Calculation (min): 12 min Charges:  OT General Charges $OT Visit: 1 Visit OT Evaluation $OT Eval Moderate Complexity: 1 Mod  03/14/2021  Rich, OTR/L  Acute Rehabilitation Services  Office:  [phone]   Metta Clines 03/14/2021, 3:13 PM

## 2021-03-14 NOTE — Op Note (Signed)
03/14/2021  10:32 AM  PATIENT:  Mitchell [name]  87 y.o. male  PRE-OPERATIVE DIAGNOSIS:  Compression fracture of thoracic 12 vertebra with delayed healing  POST-OPERATIVE DIAGNOSIS:  Compression fracture of thoracic 12 vertebra with delayed healing  PROCEDURE:  Procedure(s) with comments: Thoracic Twelve  KYPHOPLASTY (N/A) - 3C/RM 21  SURGEON:  Surgeon(s) and Role:    Erline Levine, MD - Primary  PHYSICIAN ASSISTANT: None  ASSISTANTS: none   ANESTHESIA:   general  EBL:  1 mL   BLOOD ADMINISTERED:none  DRAINS: none   LOCAL MEDICATIONS USED:  MARCAINE    and LIDOCAINE   SPECIMEN:  No Specimen  DISPOSITION OF SPECIMEN:  N/A  COUNTS:  YES  TOURNIQUET:  * No tourniquets in log *  DICTATION: DICTATION: Patient is 87 year old man with painful T 12 fracture.   It was elected to take him to surgery for kyphoplasty procedure.  PROCEDURE:  Following the smooth and uncomplicated induction of general endotracheal anesthesia, the patient was placed in a prone position on chest rolls.  C-arm fluoroscopy was positioned in both the AP and lateral planes, centered on the T 12 vertebra.  His back was prepped and draped in the usual sterile fashion with Duraprep.  Using a right uni-pedicular approach, the right T 12 pedicle and vertebral body were entered with the trochar using standard landmarks.  The drill was used, followed by a 15 cc Kyphon balloon, which was used to re-expand the broken vertebra.  Subsequently, 4 cc of bone cement was placed into the void created by the balloon and was seen to fill the fracture cleft and fill the vertebra in both the AP and lateral direction with good interdigitation and without apparent extravasation. The bone void filler was then removed.  Final X-ray demonstrated good filling within the fractured vertebra.  The incision was closed with a single 3-0 vicryl stitch and dressed with Dermabond. The patient was returned to the [facility] and extubated in the OR and  taken to Recovery in stable and satisfactory condition, having tolerated the procedure well.  Counts were correct at the end of the case.   PLAN OF CARE: Admit for overnight observation  PATIENT DISPOSITION:  PACU - hemodynamically stable.   Delay start of Pharmacological VTE agent (>24hrs) due to surgical blood loss or risk of bleeding: yes

## 2021-03-14 NOTE — Discharge Summary (Signed)
Physician Discharge Summary  Patient ID: Mitchell [name] MRN: [id number] DOB/AGE: 1934/06/07 87 y.o.  Admit date: 03/14/2021 Discharge date: 03/14/2021  Admission Diagnoses: Compression fracture of thoracic 12 vertebra with delayed healing  Discharge Diagnoses: Compression fracture of thoracic 12 vertebra with delayed healing Active Problems:   Thoracic vertebral fracture [facility])   Discharged Condition: good  Hospital Course: The patient was admitted on 03/14/2021 and taken to the operating room where the patient underwent T12 kyphoplasty. The patient tolerated the procedure well and was taken to the recovery room and then to the floor in stable condition. The hospital course was routine. There were no complications. The wound remained clean dry and intact. Pt had appropriate back soreness. No complaints of leg pain or new N/T/W. The patient remained afebrile with stable vital signs, and tolerated a regular diet. The patient continued to increase activities, and pain was well controlled with oral pain medications.   Consults: None  Significant Diagnostic Studies: radiology: X-Ray: intraoperative   Treatments: surgery: Thoracic Twelve  KYPHOPLASTY  Discharge Exam: Blood pressure (!) 142/79, pulse (!) 58, temperature 97.6 F (36.4 C), resp. rate 16, height '5\' 11"'$  (1.803 m), weight 93 kg, SpO2 95 %. Physical Exam: Patient is awake, A/O X 4, conversant, and in good spirits. They are in NAD and VSS. Speech is fluent and appropriate. Doing well. MAEW with good strength. Sensation to light touch is intact. PERLA, EOMI. CNs grossly intact. Dressing is clean dry intact. Incision is well approximated with no drainage, erythema, or edema.   Disposition: Discharge disposition: 01-Home or Self Care        Allergies as of 03/14/2021       Reactions   Lisinopril Cough   Losartan Cough   Penicillins Rash        Medication List     TAKE these medications    acetaminophen 500 MG  tablet Commonly known as: TYLENOL Take 1,000 mg by mouth every 6 (six) hours as needed.   albuterol 108 (90 Base) MCG/ACT inhaler Commonly known as: VENTOLIN HFA Inhale 1-2 puffs into the lungs every 6 (six) hours as needed for wheezing or shortness of breath.   allopurinol 100 MG tablet Commonly known as: ZYLOPRIM Take 200 mg by mouth daily.   amLODipine 2.5 MG tablet Commonly known as: NORVASC Take 2.5 mg by mouth daily.   aspirin EC 81 MG tablet Take 81 mg by mouth daily.   bisoprolol 5 MG tablet Commonly known as: ZEBETA Take 2.5 mg by mouth daily.   colchicine 0.6 MG tablet Take 0.6 mg by mouth 2 (two) times daily as needed (gout).   fluticasone 50 MCG/ACT nasal spray Commonly known as: FLONASE Place 2 sprays into both nostrils daily as needed for allergies or rhinitis.   furosemide 20 MG tablet Commonly known as: LASIX Take 20 mg by mouth.   gabapentin 300 MG capsule Commonly known as: NEURONTIN Take 600 mg by mouth 3 (three) times daily.   hydrocortisone 2.5 % cream Apply 1 application topically 2 (two) times daily as needed (irritation).   ibuprofen 600 MG tablet Commonly known as: ADVIL Take 600 mg by mouth every 6 (six) hours as needed for moderate pain.   leuprolide (6 Month) 45 MG injection Commonly known as: ELIGARD Inject 45 mg into the skin every 6 (six) months.   meclizine 25 MG tablet Commonly known as: ANTIVERT Take 25 mg by mouth 3 (three) times daily as needed for dizziness.   multivitamin with minerals tablet  Take 1 tablet by mouth daily.   predniSONE 5 MG tablet Commonly known as: DELTASONE Take 5 mg by mouth daily with breakfast.   Systane Balance 0.6 % Soln Generic drug: Propylene Glycol Place 1 drop into both eyes daily as needed (dry eyes).   traMADol 50 MG tablet Commonly known as: ULTRAM Take 50 mg by mouth every 8 (eight) hours as needed for moderate pain.       ASK your doctor about these medications     atorvastatin 10 MG tablet Commonly known as: LIPITOR TAKE 1 TABLET BY MOUTH EVERY DAY   hydrochlorothiazide 25 MG tablet Commonly known as: HYDRODIURIL TAKE 1 TABLET BY MOUTH EVERY DAY         Signed: Marvis Moeller, DNP, NP-C 03/14/2021, 3:13 PM

## 2021-03-14 NOTE — Anesthesia Procedure Notes (Signed)
Procedure Name: Intubation Date/Time: 03/14/2021 9:29 AM Performed by: Janace Litten, CRNA Pre-anesthesia Checklist: Patient identified, Emergency Drugs available, Suction available and Patient being monitored Patient Re-evaluated:Patient Re-evaluated prior to induction Oxygen Delivery Method: Circle System Utilized Preoxygenation: Pre-oxygenation with 100% oxygen Induction Type: IV induction Ventilation: Mask ventilation without difficulty Laryngoscope Size: Mac and 3 Grade View: Grade I Tube type: Oral Tube size: 7.5 mm Number of attempts: 1 Airway Equipment and Method: Stylet and Oral airway Placement Confirmation: ETT inserted through vocal cords under direct vision, positive ETCO2 and breath sounds checked- equal and bilateral Secured at: 23 cm Tube secured with: Tape Dental Injury: Teeth and Oropharynx as per pre-operative assessment

## 2021-03-14 NOTE — Transfer of Care (Signed)
Immediate Anesthesia Transfer of Care Note  Patient: Mitchell [name]  Procedure(s) Performed: Thoracic Twelve  KYPHOPLASTY  Patient Location: PACU  Anesthesia Type:General  Level of Consciousness: oriented, drowsy, patient cooperative and responds to stimulation  Airway & Oxygen Therapy: Patient Spontanous Breathing and Patient connected to nasal cannula oxygen  Post-op Assessment: Report given to RN, Post -op Vital signs reviewed and stable and Patient moving all extremities X 4  Post vital signs: Reviewed and stable  Last Vitals:  Vitals Value Taken Time  BP 135/74 03/14/21 1026  Temp    Pulse 64 03/14/21 1029  Resp 16 03/14/21 1029  SpO2 93 % 03/14/21 1029  Vitals shown include unvalidated device data.  Last Pain:  Vitals:   03/14/21 0819  TempSrc:   PainSc: 0-No pain         Complications: No notable events documented.

## 2021-03-14 NOTE — Progress Notes (Signed)
Patient is awake, alert, conversant.  WAEW with good strength.  Back pain much improved.  Doing well.

## 2021-03-14 NOTE — Progress Notes (Addendum)
Pharmacy Antibiotic Note  Mitchell [name] is a 87 y.o. male admitted on 03/14/2021 with surgical prophylaxis.  Pharmacy has been consulted for vancomycin dosing.  Noted PCN allergy with rash. No history of PCN or cephalosporins here. Last Scr in June, ClCr ~40 ml/min. Received 1g vancomycin pre-op. No drain per note  Plan: Vancomycin '500mg'$  x1   Height: '5\' 11"'$  (180.3 cm) Weight: 93 kg (205 lb) IBW/kg (Calculated) : 75.3  Temp (24hrs), Avg:97.5 F (36.4 C), Min:97.2 F (36.2 C), Max:97.8 F (36.6 C)  No results for input(s): WBC, CREATININE, LATICACIDVEN, VANCOTROUGH, VANCOPEAK, VANCORANDOM, GENTTROUGH, GENTPEAK, GENTRANDOM, TOBRATROUGH, TOBRAPEAK, TOBRARND, AMIKACINPEAK, AMIKACINTROU, AMIKACIN in the last 168 hours.  CrCl cannot be calculated (Patient's most recent lab result is older than the maximum 21 days allowed.).    Allergies  Allergen Reactions   Lisinopril Cough   Losartan Cough   Penicillins Rash   Benetta Spar, PharmD, BCPS, BCCP Clinical Pharmacist  Please check AMION for all [place] phone numbers After 10:00 PM, call [place] [phone]

## 2021-03-14 NOTE — Brief Op Note (Signed)
03/14/2021  10:32 AM  PATIENT:  Mitchell [name]  87 y.o. male  PRE-OPERATIVE DIAGNOSIS:  Compression fracture of thoracic 12 vertebra with delayed healing  POST-OPERATIVE DIAGNOSIS:  Compression fracture of thoracic 12 vertebra with delayed healing  PROCEDURE:  Procedure(s) with comments: Thoracic Twelve  KYPHOPLASTY (N/A) - 3C/RM 21  SURGEON:  Surgeon(s) and Role:    Erline Levine, MD - Primary  PHYSICIAN ASSISTANT: None  ASSISTANTS: none   ANESTHESIA:   general  EBL:  1 mL   BLOOD ADMINISTERED:none  DRAINS: none   LOCAL MEDICATIONS USED:  MARCAINE    and LIDOCAINE   SPECIMEN:  No Specimen  DISPOSITION OF SPECIMEN:  N/A  COUNTS:  YES  TOURNIQUET:  * No tourniquets in log *  DICTATION: DICTATION: Patient is 87 year old man with painful T 12 fracture.   It was elected to take him to surgery for kyphoplasty procedure.  PROCEDURE:  Following the smooth and uncomplicated induction of general endotracheal anesthesia, the patient was placed in a prone position on chest rolls.  C-arm fluoroscopy was positioned in both the AP and lateral planes, centered on the T 12 vertebra.  His back was prepped and draped in the usual sterile fashion with Duraprep.  Using a right uni-pedicular approach, the right T 12 pedicle and vertebral body were entered with the trochar using standard landmarks.  The drill was used, followed by a 15 cc Kyphon balloon, which was used to re-expand the broken vertebra.  Subsequently, 4 cc of bone cement was placed into the void created by the balloon and was seen to fill the fracture cleft and fill the vertebra in both the AP and lateral direction with good interdigitation and without apparent extravasation. The bone void filler was then removed.  Final X-ray demonstrated good filling within the fractured vertebra.  The incision was closed with a single 3-0 vicryl stitch and dressed with Dermabond. The patient was returned to the [facility] and extubated in the OR and  taken to Recovery in stable and satisfactory condition, having tolerated the procedure well.  Counts were correct at the end of the case.   PLAN OF CARE: Admit for overnight observation  PATIENT DISPOSITION:  PACU - hemodynamically stable.   Delay start of Pharmacological VTE agent (>24hrs) due to surgical blood loss or risk of bleeding: yes

## 2021-03-14 NOTE — Discharge Instructions (Signed)
Wound Care Leave incision open to air. You may shower. Do not scrub directly on incision.  Do not put any creams, lotions, or ointments on incision. Activity Walk each and every day, increasing distance each day. No lifting greater than 5 lbs.  Avoid bending, arching, and twisting. No driving for 2 weeks; may ride as a passenger locally.  Diet Resume your normal diet.   Call Your Doctor If Any of These Occur Redness, drainage, or swelling at the wound.  Temperature greater than 101 degrees. Severe pain not relieved by pain medication. Incision starts to come apart. Follow Up Appt Call [phone]) for problems.

## 2021-03-14 NOTE — H&P (Signed)
Patient ID:   [id number] Patient: Mitchell [name]  Date of Birth: 1933/11/26 Visit Type: Office Visit   Date: 02/27/2021 12:00 PM Provider: Marchia Meiers. Vertell Limber MD   This 87 year old male presents for back pain.  HISTORY OF PRESENT ILLNESS: 1.  back pain  Patient returns in follow-up to her his appointment with Dr. Jaynee Eagles; and for review of recent lumbar imaging.  The patient saw Dr.Ahern and she felt that he had severe bilateral peripheral neuropathy and no evidence of S1 radiculopathy.  She repeated his lumbar MRI which shows at T12 compression fracture with significant height loss.  This appears to be an acute fracture with edema in the vertebral body.  There does not appear to be significant retropulsion of bone or any spinal cord compression.  He was also found to have spinal stenosis at the L3-4 level.  I have recommended against surgical correction of stenosis due to his severe scoliosis, age and comorbidities.  I have recommended T12 kyphoplasty to relieve his intractable back pain which is grading at 8/10 in severity.      Medical/Surgical/Interim History Reviewed, no change.  Last detailed document date:11/20/2020.     PAST MEDICAL HISTORY, SURGICAL HISTORY, FAMILY HISTORY, SOCIAL HISTORY AND REVIEW OF SYSTEMS I have reviewed the patient's past medical, surgical, family and social history as well as the comprehensive review of systems as included on the [facility] history form dated 11/20/2020, which I have signed.  Family History: Reviewed, no changes.  Last detailed document date:11/20/2020.   Social History: Reviewed, no changes. Last detailed document date: 11/20/2020.    MEDICATIONS: (added, continued or stopped this visit) Started Medication Directions Instruction Stopped 12/17/2020 Mobic 7.5 mg tablet take 1 tablet by oral route  every day as needed   12/27/2020 tizanidine 2 mg tablet take 1 tablet by oral route  every 6 -  8 hours as needed not to exceed 3 doses in 24 hours   01/03/2021 tramadol 50 mg tablet take 1 tablet by oral route  every 8 hours as needed      ALLERGIES: Ingredient Reaction Medication Name Comment PENICILLINS        PHYSICAL EXAM:  Vitals Date Temp F BP Pulse Ht In Wt Lb BMI BSA Pain Score 02/27/2021  125/74 80 69 200 29.53  8/10     IMPRESSION:  T12 compression fracture.  Severe intractable back pain.  PLAN: T12 kyphoplasty procedure.  Risks and benefits were discussed in detail with the patient he wishes to proceed.  Orders: Diagnostic Procedures: Assessment Procedure S22.080G Thoraco-lumbar Spine- AP/Lat Instruction(s)/Education: Assessment Instruction R03.0 Hypertension education Z68.29 Dietary management education, guidance, and counseling  Completed Orders (this encounter) Order Details Reason Side Interpretation Result Initial Treatment Date Region Hypertension education Patient will monitor and contact primary care physician if needed.       Dietary management education, guidance, and counseling Encouraged patient to eat well balanced diet.        Assessment/Plan  # Detail Type Description  1. Assessment Idiopathic peripheral neuropathy (G60.9).     2. Assessment Idiopathic scoliosis in adult patient (M41.20).     3. Assessment Spondylolisthesis of lumbar region (M43.16).     4. Assessment Degenerative lumbar spinal stenosis (M48.061).     5. Assessment Compression fracture of T12 vertebra with delayed healing, subsequent encounter (S22.080G).     6. Assessment Elevated blood-pressure reading, w/o diagnosis of htn (R03.0).     7. Assessment Body mass index (BMI) 29.0-29.9, adult (  Darrin.Shuck.29).  Plan Orders Today's instructions / counseling include(s) Dietary management education, guidance, and counseling. Clinical information/comments: Encouraged patient to eat well  balanced diet.       Pain Management Plan Pain Scale: 8/10. Method: Numeric Pain Intensity Scale. Location: back. Onset: 08/26/2009. Duration: varies. Quality: discomforting. Pain management follow-up plan of care: Patient will continue medication management..              Provider:  Marchia Meiers. Vertell Limber MD  02/28/2021 03:00 PM    Dictation edited by: Marchia Meiers. Vertell Limber    CC Providers: Eber Hong [facility] [address] [place]  [address]-   Esvin Hnat MD  [address] [place], [place] [address]               Electronically signed by Marchia Meiers. Vertell Limber MD on 02/28/2021 03:00 PM

## 2021-03-14 NOTE — Anesthesia Postprocedure Evaluation (Signed)
Anesthesia Post Note  Patient: Mitchell [name]  Procedure(s) Performed: Thoracic Twelve  KYPHOPLASTY     Patient location during evaluation: PACU Anesthesia Type: General Level of consciousness: awake and alert Pain management: pain level controlled Vital Signs Assessment: post-procedure vital signs reviewed and stable Respiratory status: spontaneous breathing, nonlabored ventilation, respiratory function stable and patient connected to nasal cannula oxygen Cardiovascular status: blood pressure returned to baseline and stable Postop Assessment: no apparent nausea or vomiting Anesthetic complications: no   No notable events documented.  Last Vitals:  Vitals:   03/14/21 1026 03/14/21 1041  BP: 135/74 140/79  Pulse: 65 (!) 59  Resp: 13 15  Temp: (!) 36.2 C   SpO2: 90% 97%    Last Pain:  Vitals:   03/14/21 1026  TempSrc:   PainSc: 0-No pain                 Lupe Handley

## 2021-03-14 NOTE — Interval H&P Note (Signed)
History and Physical Interval Note:  03/14/2021 9:00 AM  Mitchell [name]  has presented today for surgery, with the diagnosis of Compression fracture of thoracic 12 vertebra with delayed healing.  The various methods of treatment have been discussed with the patient and family. After consideration of risks, benefits and other options for treatment, the patient has consented to  Procedure(s) with comments: Thoracic 12 KYPHOPLASTY (N/A) - 3C/RM 21 as a surgical intervention.  The patient's history has been reviewed, patient examined, no change in status, stable for surgery.  I have reviewed the patient's chart and labs.  Questions were answered to the patient's satisfaction.     Peggyann Shoals

## 2021-03-14 NOTE — Progress Notes (Signed)
Pt. discharged home accompanied by family. Discharge instructions given with verbalization of understanding. Incision site on back with no s/s of infection - no swelling, redness, bleeding, and/or drainage noted. Opportunity given to ask questions but no question asked. Pt. transported out of this unit in wheelchair by the volunteer

## 2021-03-15 ENCOUNTER — Encounter (HOSPITAL_COMMUNITY): Payer: Self-pay | Admitting: Neurosurgery

## 2021-03-23 ENCOUNTER — Other Ambulatory Visit: Payer: Self-pay | Admitting: Cardiology

## 2021-04-29 ENCOUNTER — Observation Stay (HOSPITAL_COMMUNITY): Payer: Medicare Other

## 2021-04-29 ENCOUNTER — Emergency Department (HOSPITAL_COMMUNITY): Payer: Medicare Other

## 2021-04-29 ENCOUNTER — Other Ambulatory Visit: Payer: Self-pay

## 2021-04-29 ENCOUNTER — Observation Stay (HOSPITAL_COMMUNITY)
Admission: EM | Admit: 2021-04-29 | Discharge: 2021-04-30 | Disposition: A | Discharge status: Home / Self Care | Payer: Medicare Other | Attending: Internal Medicine | Admitting: Internal Medicine

## 2021-04-29 ENCOUNTER — Encounter (HOSPITAL_COMMUNITY): Payer: Self-pay | Admitting: Internal Medicine

## 2021-04-29 DIAGNOSIS — I2510 Atherosclerotic heart disease of native coronary artery without angina pectoris: Secondary | ICD-10-CM | POA: Diagnosis present

## 2021-04-29 DIAGNOSIS — Z8546 Personal history of malignant neoplasm of prostate: Secondary | ICD-10-CM | POA: Diagnosis not present

## 2021-04-29 DIAGNOSIS — Z20822 Contact with and (suspected) exposure to covid-19: Secondary | ICD-10-CM | POA: Diagnosis not present

## 2021-04-29 DIAGNOSIS — Z79899 Other long term (current) drug therapy: Secondary | ICD-10-CM | POA: Diagnosis not present

## 2021-04-29 DIAGNOSIS — E119 Type 2 diabetes mellitus without complications: Secondary | ICD-10-CM | POA: Insufficient documentation

## 2021-04-29 DIAGNOSIS — R0602 Shortness of breath: Secondary | ICD-10-CM | POA: Diagnosis not present

## 2021-04-29 DIAGNOSIS — I10 Essential (primary) hypertension: Secondary | ICD-10-CM | POA: Diagnosis not present

## 2021-04-29 DIAGNOSIS — E785 Hyperlipidemia, unspecified: Secondary | ICD-10-CM

## 2021-04-29 DIAGNOSIS — I249 Acute ischemic heart disease, unspecified: Secondary | ICD-10-CM

## 2021-04-29 DIAGNOSIS — M47817 Spondylosis without myelopathy or radiculopathy, lumbosacral region: Secondary | ICD-10-CM | POA: Insufficient documentation

## 2021-04-29 DIAGNOSIS — R079 Chest pain, unspecified: Principal | ICD-10-CM | POA: Diagnosis present

## 2021-04-29 DIAGNOSIS — S22089A Unspecified fracture of T11-T12 vertebra, initial encounter for closed fracture: Secondary | ICD-10-CM | POA: Insufficient documentation

## 2021-04-29 DIAGNOSIS — K219 Gastro-esophageal reflux disease without esophagitis: Secondary | ICD-10-CM

## 2021-04-29 DIAGNOSIS — M47816 Spondylosis without myelopathy or radiculopathy, lumbar region: Secondary | ICD-10-CM | POA: Insufficient documentation

## 2021-04-29 LAB — RESP PANEL BY RT-PCR (FLU A&B, COVID) ARPGX2
Influenza A by PCR: NEGATIVE
Influenza B by PCR: NEGATIVE
SARS Coronavirus 2 by RT PCR: NEGATIVE

## 2021-04-29 LAB — BASIC METABOLIC PANEL
Anion gap: 10 (ref 5–15)
BUN: 20 mg/dL (ref 8–23)
CO2: 24 mmol/L (ref 22–32)
Calcium: 9.4 mg/dL (ref 8.9–10.3)
Chloride: 104 mmol/L (ref 98–111)
Creatinine, Ser: 1.14 mg/dL (ref 0.61–1.24)
GFR, Estimated: >60 mL/min (ref >60)
Glucose, Bld: 122 mg/dL — ABNORMAL HIGH (ref 70–99)
Potassium: 3.3 mmol/L — ABNORMAL LOW (ref 3.5–5.1)
Sodium: 138 mmol/L (ref 135–145)

## 2021-04-29 LAB — CBC
HCT: 44.7 % (ref 39.0–52.0)
Hemoglobin: 14.6 g/dL (ref 13.0–17.0)
MCH: 30.3 pg (ref 26.0–34.0)
MCHC: 32.7 g/dL (ref 30.0–36.0)
MCV: 92.7 fL (ref 80.0–100.0)
Platelets: 246 10*3/uL (ref 150–400)
RBC: 4.82 MIL/uL (ref 4.22–5.81)
RDW: 15.6 % — ABNORMAL HIGH (ref 11.5–15.5)
WBC: 9.7 10*3/uL (ref 4.0–10.5)
nRBC: 0 % (ref 0.0–0.2)

## 2021-04-29 LAB — MAGNESIUM: Magnesium: 1.7 mg/dL (ref 1.7–2.4)

## 2021-04-29 LAB — D-DIMER, QUANTITATIVE: D-Dimer, Quant: 1.02 ug/mL-FEU — ABNORMAL HIGH (ref 0.00–0.50)

## 2021-04-29 LAB — BRAIN NATRIURETIC PEPTIDE: B Natriuretic Peptide: 99 pg/mL (ref 0.0–100.0)

## 2021-04-29 LAB — TROPONIN I (HIGH SENSITIVITY)
Troponin I (High Sensitivity): 10 ng/L (ref <18)
Troponin I (High Sensitivity): 9 ng/L (ref <18)

## 2021-04-29 MED ORDER — IOHEXOL 350 MG/ML SOLN
100.0000 mL | Freq: Once | INTRAVENOUS | Status: AC | PRN
  Administered 2021-04-29: 100 mL via INTRAVENOUS

## 2021-04-29 MED ORDER — ALLOPURINOL 100 MG PO TABS
200.0000 mg | ORAL_TABLET | Freq: Every day | ORAL | Status: DC
  Administered 2021-04-29 – 2021-04-30 (×2): 200 mg via ORAL
  Filled 2021-04-29 (×2): qty 2

## 2021-04-29 MED ORDER — ACETAMINOPHEN 650 MG RE SUPP: 650.0000 mg | Freq: Four times a day (QID) | RECTAL | Status: DC | PRN

## 2021-04-29 MED ORDER — AMLODIPINE BESYLATE 5 MG PO TABS
2.5000 mg | ORAL_TABLET | Freq: Every day | ORAL | Status: DC
  Administered 2021-04-29 – 2021-04-30 (×2): 2.5 mg via ORAL
  Filled 2021-04-29 (×2): qty 1

## 2021-04-29 MED ORDER — ASPIRIN EC 81 MG PO TBEC
81.0000 mg | DELAYED_RELEASE_TABLET | Freq: Every day | ORAL | Status: DC
  Administered 2021-04-30: 81 mg via ORAL
  Filled 2021-04-29: qty 1

## 2021-04-29 MED ORDER — NITROGLYCERIN 0.4 MG SL SUBL
0.4000 mg | SUBLINGUAL_TABLET | Freq: Once | SUBLINGUAL | Status: AC
  Administered 2021-04-29: 0.4 mg via SUBLINGUAL
  Filled 2021-04-29: qty 1

## 2021-04-29 MED ORDER — POLYETHYLENE GLYCOL 3350 17 G PO PACK: 17.0000 g | PACK | Freq: Every day | ORAL | Status: DC | PRN

## 2021-04-29 MED ORDER — ENOXAPARIN SODIUM 40 MG/0.4ML IJ SOSY
40.0000 mg | PREFILLED_SYRINGE | INTRAMUSCULAR | Status: DC
  Administered 2021-04-29: 40 mg via SUBCUTANEOUS
  Filled 2021-04-29: qty 0.4

## 2021-04-29 MED ORDER — ENSURE ENLIVE PO LIQD
237.0000 mL | Freq: Two times a day (BID) | ORAL | Status: DC
  Administered 2021-04-30: 237 mL via ORAL

## 2021-04-29 MED ORDER — LORAZEPAM 0.5 MG PO TABS
0.5000 mg | ORAL_TABLET | Freq: Once | ORAL | Status: AC
  Administered 2021-04-29: 0.5 mg via ORAL
  Filled 2021-04-29: qty 1

## 2021-04-29 MED ORDER — PREDNISONE 10 MG PO TABS
5.0000 mg | ORAL_TABLET | Freq: Every day | ORAL | Status: DC
  Filled 2021-04-29: qty 1

## 2021-04-29 MED ORDER — ONDANSETRON HCL 4 MG PO TABS: 4.0000 mg | ORAL_TABLET | Freq: Four times a day (QID) | ORAL | Status: DC | PRN

## 2021-04-29 MED ORDER — BISOPROLOL FUMARATE 5 MG PO TABS
5.0000 mg | ORAL_TABLET | Freq: Every day | ORAL | Status: DC
  Administered 2021-04-29 – 2021-04-30 (×2): 5 mg via ORAL
  Filled 2021-04-29 (×2): qty 1

## 2021-04-29 MED ORDER — POTASSIUM CHLORIDE CRYS ER 20 MEQ PO TBCR
40.0000 meq | EXTENDED_RELEASE_TABLET | Freq: Once | ORAL | Status: AC
  Administered 2021-04-29: 40 meq via ORAL
  Filled 2021-04-29: qty 2

## 2021-04-29 MED ORDER — ASPIRIN EC 81 MG PO TBEC: 81.0000 mg | DELAYED_RELEASE_TABLET | Freq: Every day | ORAL | Status: DC

## 2021-04-29 MED ORDER — ONDANSETRON HCL 4 MG/2ML IJ SOLN: 4.0000 mg | Freq: Four times a day (QID) | INTRAMUSCULAR | Status: DC | PRN

## 2021-04-29 MED ORDER — ATORVASTATIN CALCIUM 10 MG PO TABS
10.0000 mg | ORAL_TABLET | Freq: Every day | ORAL | Status: DC
  Administered 2021-04-29 – 2021-04-30 (×2): 10 mg via ORAL
  Filled 2021-04-29 (×2): qty 1

## 2021-04-29 MED ORDER — ASPIRIN 325 MG PO TABS
ORAL_TABLET | ORAL | Status: AC
  Filled 2021-04-29: qty 1

## 2021-04-29 MED ORDER — GABAPENTIN 300 MG PO CAPS
600.0000 mg | ORAL_CAPSULE | Freq: Three times a day (TID) | ORAL | Status: DC
  Administered 2021-04-29 – 2021-04-30 (×3): 600 mg via ORAL
  Filled 2021-04-29 (×3): qty 2

## 2021-04-29 MED ORDER — ASPIRIN 325 MG PO TABS
325.0000 mg | ORAL_TABLET | Freq: Once | ORAL | Status: AC
  Administered 2021-04-29: 325 mg via ORAL
  Filled 2021-04-29: qty 1

## 2021-04-29 MED ORDER — ACETAMINOPHEN 325 MG PO TABS: 650.0000 mg | ORAL_TABLET | Freq: Four times a day (QID) | ORAL | Status: DC | PRN

## 2021-04-29 NOTE — H&P (Signed)
History and Physical    Mitchell [name] JKD:[id number] DOB: 1933-12-12 DOA: 04/29/2021  PCP: Eber Hong, MD   Patient coming from: Home  I have personally briefly reviewed patient's old medical records in [place]  Chief Complaint: Chest pain  HPI: Mitchell [name] is a 87 y.o. male with medical history significant for  CAD, HTN, DM, Prostate cancer.  Patient presented to the Ed with c/o chest pain that started last night while he was resting.  Chest pain was right sided, with associated difficulty breathing and nausea, radiating down his left and the his right arms. He had some diaphoresis earlier before onset of chest pain.  He reports chronic bilateral lower extremity swelling for which he takes Lasix, but this has not helped.  ED Course: Tachycardic- 102 to 109, respiratory rate 17-23.  Blood pressure systolic 099-833.  O2 sats 92 to 97% on room air.  Troponins 10>9.  EKG shows sinus tachycardia with first-degree AV block no significant change from prior.  Potassium 3.3. D-dimer was checked and elevated at 1.02.  Two-view chest x-ray without acute abnormality. EDP talked to cardiologist on-call at [facility] Dr. Marisue Ivan, recommended admitting here if troponins are within normal limits.  Review of Systems: As per HPI all other systems reviewed and negative.  Past Medical History:  Diagnosis Date   Allergic rhinitis    CAD (coronary artery disease)    Moderate, nonobstructive disease   Dupuytren's contracture    Right hand   Essential hypertension    GERD (gastroesophageal reflux disease)    Gout    Herpes zoster    twice   Hyperlipidemia    Idiopathic scoliosis in adult patient    Inguinal hernia    Right sided   Internal hemorrhoids    Lumbosacral spondylosis    Neuropathy    Pre-diabetes    Prostate cancer ([place])    with bone mets- has been treated with chemo    Past Surgical History:  Procedure Laterality Date   EYE SURGERY Bilateral    cataract   HERNIA REPAIR  Bilateral    INTRAVASCULAR PRESSURE WIRE/FFR STUDY N/A 12/08/2019   Procedure: INTRAVASCULAR PRESSURE WIRE/FFR STUDY;  Surgeon: Leonie Man, MD;  Location: [facility];  Service: Cardiovascular;  Laterality: N/A;   KYPHOPLASTY N/A 03/14/2021   Procedure: Thoracic Twelve  KYPHOPLASTY;  Surgeon: Erline Levine, MD;  Location: [facility];  Service: Neurosurgery;  Laterality: N/A;  3C/RM 21   LEFT HEART CATH AND CORONARY ANGIOGRAPHY N/A 12/08/2019   Procedure: LEFT HEART CATH AND CORONARY ANGIOGRAPHY;  Surgeon: Leonie Man, MD;  Location: [facility];  Service: Cardiovascular;  Laterality: N/A;   TYMPANIC MEMBRANE REPAIR     VASECTOMY       reports that he has never smoked. He has never used smokeless tobacco. He reports that he does not currently use alcohol. He reports that he does not use drugs.  Allergies  Allergen Reactions   Lisinopril Cough   Losartan Cough   Penicillins Rash    Family History  Problem Relation Age of Onset   Heart failure Mother    Heart failure Father    CAD Father    Cancer Sister    CAD Brother    Stroke Brother    Neuropathy Neg Hx     Prior to Admission medications   Medication Sig Start Date End Date Taking? Authorizing Provider  acetaminophen (TYLENOL) 500 MG tablet Take 1,000 mg by mouth  every 6 (six) hours as needed.    [provider]  albuterol (VENTOLIN HFA) 108 (90 Base) MCG/ACT inhaler Inhale 1-2 puffs into the lungs every 6 (six) hours as needed for wheezing or shortness of breath.    [provider]  allopurinol (ZYLOPRIM) 100 MG tablet Take 200 mg by mouth daily.     [provider]  amLODipine (NORVASC) 2.5 MG tablet Take 2.5 mg by mouth daily.    [provider]  aspirin EC 81 MG tablet Take 81 mg by mouth daily.    [provider]  atorvastatin (LIPITOR) 10 MG tablet TAKE 1 TABLET BY MOUTH EVERY DAY Patient taking differently: Take 10 mg by mouth daily. 12/17/20   Verta Ellen., NP  bisoprolol (ZEBETA) 5 MG tablet TAKE 1 TABLET BY MOUTH EVERY DAY 03/25/21   Satira Sark, MD  colchicine 0.6 MG tablet Take 0.6 mg by mouth 2 (two) times daily as needed (gout).    [provider]  fluticasone (FLONASE) 50 MCG/ACT nasal spray Place 2 sprays into both nostrils daily as needed for allergies or rhinitis.    [provider]  furosemide (LASIX) 20 MG tablet Take 20 mg by mouth.    [provider]  gabapentin (NEURONTIN) 300 MG capsule Take 600 mg by mouth 3 (three) times daily.     [provider]  hydrochlorothiazide (HYDRODIURIL) 25 MG tablet TAKE 1 TABLET BY MOUTH EVERY DAY Patient taking differently: Take 25 mg by mouth 2 (two) times daily. 01/22/21   Satira Sark, MD  hydrocortisone 2.5 % cream Apply 1 application topically 2 (two) times daily as needed (irritation).    [provider]  ibuprofen (ADVIL) 600 MG tablet Take 600 mg by mouth every 6 (six) hours as needed for moderate pain.    [provider]  ketoconazole (NIZORAL) 2 % cream Apply topically 2 (two) times daily. 03/18/21   [provider]  leuprolide, 6 Month, (ELIGARD) 45 MG injection Inject 45 mg into the skin every 6 (six) months.    [provider]  meclizine (ANTIVERT) 25 MG tablet Take 25 mg by mouth 3 (three) times daily as needed for dizziness.    [provider]  meloxicam (MOBIC) 7.5 MG tablet Take 7.5 mg by mouth daily. 03/11/21   [provider]  Multiple Vitamins-Minerals (MULTIVITAMIN WITH MINERALS) tablet Take 1 tablet by mouth daily.    [provider]  predniSONE (DELTASONE) 5 MG tablet Take 5 mg by mouth daily with breakfast.    [provider]  Propylene Glycol (SYSTANE BALANCE) 0.6 % SOLN Place 1 drop into both eyes daily as needed (dry eyes).    [provider]  traMADol (ULTRAM) 50 MG tablet Take 50 mg by mouth every 8 (eight) hours as needed for moderate pain.     [provider]    Physical Exam: Vitals:   04/29/21 1637 04/29/21 1700 04/29/21 1730 04/29/21 1810  BP:  (!) 115/92 (!) 136/93 113/74  Pulse:  (!) 108 (!) 104 (!) 109  Resp:  (!) 22 (!) 23 (!) 21  Temp:      SpO2:  94% 96% 92%  Weight: 94 kg     Height: 5\' 11"  (1.803 m)       Constitutional: NAD, calm, comfortable Vitals:   04/29/21 1637 04/29/21 1700 04/29/21 1730 04/29/21 1810  BP:  (!) 115/92 (!) 136/93 113/74  Pulse:  (!) 108 (!) 104 (!) 109  Resp:  (!) 22 (!) 23 (!) 21  Temp:      SpO2:  94% 96% 92%  Weight: 94 kg     Height: 5\' 11"  (1.803 m)      Eyes: lids and conjunctivae normal ENMT: Mucous membranes are moist.  Neck: normal, supple, no masses, no thyromegaly Respiratory: clear to auscultation bilaterally, no wheezing, no crackles. Normal respiratory effort. No accessory muscle use.  Cardiovascular: Mild tachycardia, regular rate and rhythm, no murmurs / rubs / gallops.  2+ pitting extremity edema to knees bilaterally extremity edema. 2+ pedal pulses.  Abdomen: no tenderness, no masses palpated. No hepatosplenomegaly. Bowel sounds positive.  Musculoskeletal: no clubbing / cyanosis. No joint deformity upper and lower extremities. Good ROM, no contractures. Normal muscle tone.  Skin: no rashes, lesions, ulcers. No induration Neurologic: No apparent cranial nerve abnormality, moving extremities spontaneously.  Psychiatric: Normal judgment and insight. Alert and oriented x 3. Normal mood.   Labs on Admission: I have personally reviewed following labs and imaging studies  CBC: Recent Labs  Lab 04/29/21 1611  WBC 9.7  HGB 14.6  HCT 44.7  MCV 92.7  PLT 160   Basic Metabolic Panel: Recent Labs  Lab 04/29/21 1611  NA 138  K 3.3*  CL 104  CO2 24  GLUCOSE 122*  BUN 20  CREATININE 1.14  CALCIUM 9.4    Radiological Exams on Admission: DG Chest 2 View  Result Date: 04/29/2021 CLINICAL DATA:  Chest pain and shortness of breath for several hours  EXAM: CHEST - 2 VIEW COMPARISON:  01/01/2021 FINDINGS: Cardiac shadow is mildly enlarged but stable. Aortic calcifications are seen. Linear scarring is noted in the bases bilaterally. No focal infiltrate or sizable effusion is seen. Changes of prior vertebral augmentation are noted. IMPRESSION: No acute abnormality noted. Electronically Signed   By: Inez Catalina M.D.   On: 04/29/2021 17:08    EKG: Independently reviewed.  Sinus tachycardia rate 106.  First-degree AV block-not new., QTc 425.  No significant change from prior  Assessment/Plan Principal Problem:   Chest pain Active Problems:   Benign essential hypertension   Coronary artery disease involving native coronary artery of native heart without angina pectoris   Chest Pain with coronary artery disease history-troponins and EKG so far unremarkable.  Last cardiac cath 11/2019, shows EF of 50 to 55%, with 55% stenosis of proximal LAD, 60% stenosis of circumflex branch and first diagonal.  Deemed moderate to severe single-vessel disease.  Patient follows with Dr. Domenic Polite - EDP talked with Dr. Marisue Ivan, okay to admit here as troponins stable. -Cardiology evaluation here in the morning -EKG in the morning -Aspirin 325mg  given x 1, continue home 81mg  daily -Resume statins, bisoprolol -D-dimer elevated mildly- 1.02, as patient is also tachycardic, lower extremity swelling, will obtain CTA chest  Bilateral lower extremity swelling-reports chronic and unchanged, he reports no response to Lasix 20 mg daily..  Weights over the past 6 months stable to improved.  Echo 01/2021, EF 55 to 60%, indeterminate LV diastolic parameters no valvular abnormalities. He is on norvasc- low dose. -  Obtain BNP - follow-up CTA chest  Hypertension-stable. -Resume bisoprolol, norvasc 2.5mg  daily -Hold HCTZ and Lasix for now with contrast exposure  History of prostate cancer-follows with oncologist at [facility]  On leuprolide injections.  Gout- - resume  allorpurinol  Seronegative rheumatoid arthritis - Continue chronic prednisone 5 mg daily  DVT prophylaxis: Lovenox Code Status: Full code-confirmed with patient at bedside. Family Communication: None at bedside Disposition Plan:  ~  1 -2 days Consults called: Cardiology Admission status:  Obs tele    Bethena Roys MD [facility]  04/29/2021, 8:53 PM

## 2021-04-29 NOTE — ED Notes (Signed)
This nurse called [facility] to give report on pt. Mitchell [name] reports that the receiving nurse, Aldona Bar is currently in report. This nurse requested a return call from [facility] when she is ready for report.

## 2021-04-29 NOTE — ED Provider Notes (Signed)
[facility] EMERGENCY DEPARTMENT Provider Note   CSN: [id number] Arrival date & time: 04/29/21  1549     History Chief Complaint  Patient presents with   Chest Pain    Mitchell [name] is a 87 y.o. male.  Patient with history of coronary artery disease, hypertension, prediabetes, hypercholesterolemia, presents to ER chief complaint of chest pain.  Describes intermittent chest tightness.  He states symptoms started sometime earlier and is persisted throughout the day until now.  Pain does not radiate elsewhere.  No activity at home seems to make the pain worse or improving.  Denies fevers or cough no vomiting or diarrhea no shortness of breath no diaphoresis.      Past Medical History:  Diagnosis Date   Allergic rhinitis    CAD (coronary artery disease)    Moderate, nonobstructive disease   Dupuytren's contracture    Right hand   Essential hypertension    GERD (gastroesophageal reflux disease)    Gout    Herpes zoster    twice   Hyperlipidemia    Idiopathic scoliosis in adult patient    Inguinal hernia    Right sided   Internal hemorrhoids    Lumbosacral spondylosis    Neuropathy    Pre-diabetes    Prostate cancer ([place])    with bone mets- has been treated with chemo    Patient Active Problem List   Diagnosis Date Noted   Thoracic vertebral fracture ([place]) 03/14/2021   Compression fracture of T12 vertebra ([place]) 02/18/2021   Weakness of both lower extremities 01/21/2021   Atypical angina ([place]) 12/08/2019   DOE (dyspnea on exertion) 12/08/2019   Accelerating angina [facility])     Past Surgical History:  Procedure Laterality Date   EYE SURGERY Bilateral    cataract   HERNIA REPAIR Bilateral    INTRAVASCULAR PRESSURE WIRE/FFR STUDY N/A 12/08/2019   Procedure: INTRAVASCULAR PRESSURE WIRE/FFR STUDY;  Surgeon: Leonie Man, MD;  Location: [facility];  Service: Cardiovascular;  Laterality: N/A;   KYPHOPLASTY N/A 03/14/2021   Procedure: Thoracic Twelve   KYPHOPLASTY;  Surgeon: Erline Levine, MD;  Location: [place];  Service: Neurosurgery;  Laterality: N/A;  3C/RM 21   LEFT HEART CATH AND CORONARY ANGIOGRAPHY N/A 12/08/2019   Procedure: LEFT HEART CATH AND CORONARY ANGIOGRAPHY;  Surgeon: Leonie Man, MD;  Location: [place];  Service: Cardiovascular;  Laterality: N/A;   TYMPANIC MEMBRANE REPAIR     VASECTOMY         Family History  Problem Relation Age of Onset   Heart failure Mother    Heart failure Father    CAD Father    Cancer Sister    CAD Brother    Stroke Brother    Neuropathy Neg Hx     Social History   Tobacco Use   Smoking status: Never   Smokeless tobacco: Never  Vaping Use   Vaping Use: Never used  Substance Use Topics   Alcohol use: Not Currently    Alcohol/week: 0.0 - 1.0 standard drinks    Comment: "at best"   Drug use: Never    Home Medications Prior to Admission medications   Medication Sig Start Date End Date Taking? Authorizing Provider  acetaminophen (TYLENOL) 500 MG tablet Take 1,000 mg by mouth every 6 (six) hours as needed.    [provider]  albuterol (VENTOLIN HFA) 108 (90 Base) MCG/ACT inhaler Inhale 1-2 puffs into the lungs every 6 (six) hours as needed for  wheezing or shortness of breath.    [provider]  allopurinol (ZYLOPRIM) 100 MG tablet Take 200 mg by mouth daily.     [provider]  amLODipine (NORVASC) 2.5 MG tablet Take 2.5 mg by mouth daily.    [provider]  aspirin EC 81 MG tablet Take 81 mg by mouth daily.    [provider]  atorvastatin (LIPITOR) 10 MG tablet TAKE 1 TABLET BY MOUTH EVERY DAY Patient taking differently: Take 10 mg by mouth daily. 12/17/20   Verta Ellen., NP  bisoprolol (ZEBETA) 5 MG tablet TAKE 1 TABLET BY MOUTH EVERY DAY 03/25/21   Satira Sark, MD  colchicine 0.6 MG tablet Take 0.6 mg by mouth 2 (two) times daily as needed (gout).    [provider]  fluticasone (FLONASE) 50  MCG/ACT nasal spray Place 2 sprays into both nostrils daily as needed for allergies or rhinitis.    [provider]  furosemide (LASIX) 20 MG tablet Take 20 mg by mouth.    [provider]  gabapentin (NEURONTIN) 300 MG capsule Take 600 mg by mouth 3 (three) times daily.     [provider]  hydrochlorothiazide (HYDRODIURIL) 25 MG tablet TAKE 1 TABLET BY MOUTH EVERY DAY Patient taking differently: Take 25 mg by mouth 2 (two) times daily. 01/22/21   Satira Sark, MD  hydrocortisone 2.5 % cream Apply 1 application topically 2 (two) times daily as needed (irritation).    [provider]  ibuprofen (ADVIL) 600 MG tablet Take 600 mg by mouth every 6 (six) hours as needed for moderate pain.    [provider]  ketoconazole (NIZORAL) 2 % cream Apply topically 2 (two) times daily. 03/18/21   [provider]  leuprolide, 6 Month, (ELIGARD) 45 MG injection Inject 45 mg into the skin every 6 (six) months.    [provider]  meclizine (ANTIVERT) 25 MG tablet Take 25 mg by mouth 3 (three) times daily as needed for dizziness.    [provider]  meloxicam (MOBIC) 7.5 MG tablet Take 7.5 mg by mouth daily. 03/11/21   [provider]  Multiple Vitamins-Minerals (MULTIVITAMIN WITH MINERALS) tablet Take 1 tablet by mouth daily.    [provider]  predniSONE (DELTASONE) 5 MG tablet Take 5 mg by mouth daily with breakfast.    [provider]  Propylene Glycol (SYSTANE BALANCE) 0.6 % SOLN Place 1 drop into both eyes daily as needed (dry eyes).    [provider]  traMADol (ULTRAM) 50 MG tablet Take 50 mg by mouth every 8 (eight) hours as needed for moderate pain.    [provider]    Allergies    Lisinopril, Losartan, and Penicillins  Review of Systems   Review of Systems  Constitutional:  Negative for fever.  HENT:  Negative for ear pain and sore throat.   Eyes:  Negative for pain.   Respiratory:  Negative for cough.   Cardiovascular:  Positive for chest pain.  Gastrointestinal:  Negative for abdominal pain.  Genitourinary:  Negative for flank pain.  Musculoskeletal:  Negative for back pain.  Skin:  Negative for color change and rash.  Neurological:  Negative for syncope.  All other systems reviewed and are negative.  Physical Exam Updated Vital Signs BP (!) 115/92   Pulse (!) 108   Temp 98.2 F (36.8 C)   Resp (!) 22   Ht 5\' 11"  (1.803 m)   Wt 94 kg  SpO2 94%   BMI 28.90 kg/m   Physical Exam Constitutional:      Appearance: He is well-developed.  HENT:     Head: Normocephalic.     Nose: Nose normal.  Eyes:     Extraocular Movements: Extraocular movements intact.  Cardiovascular:     Rate and Rhythm: Normal rate.  Pulmonary:     Effort: Pulmonary effort is normal.  Musculoskeletal:     Right lower leg: No edema.     Left lower leg: No edema.  Skin:    Coloration: Skin is not jaundiced.  Neurological:     Mental Status: He is alert. Mental status is at baseline.    ED Results / Procedures / Treatments   Labs (all labs ordered are listed, but only abnormal results are displayed) Labs Reviewed  BASIC METABOLIC PANEL - Abnormal; Notable for the following components:      Result Value   Potassium 3.3 (*)    Glucose, Bld 122 (*)    All other components within normal limits  CBC - Abnormal; Notable for the following components:   RDW 15.6 (*)    All other components within normal limits  RESP PANEL BY RT-PCR (FLU A&B, COVID) ARPGX2  TROPONIN I (HIGH SENSITIVITY)  TROPONIN I (HIGH SENSITIVITY)    EKG EKG Interpretation  Date/Time:  Monday April 29 2021 16:05:02 EDT Ventricular Rate:  106 PR Interval:  222 QRS Duration: 90 QT Interval:  320 QTC Calculation: 425 R Axis:   -21 Text Interpretation: Sinus tachycardia with 1st degree A-V block Minimal voltage criteria for LVH, may be normal variant ( R in aVL ) Anterolateral infarct  , age undetermined Abnormal ECG Confirmed by Thamas Jaegers ([id number]) on 04/29/2021 5:41:33 PM  Radiology DG Chest 2 View  Result Date: 04/29/2021 CLINICAL DATA:  Chest pain and shortness of breath for several hours EXAM: CHEST - 2 VIEW COMPARISON:  01/01/2021 FINDINGS: Cardiac shadow is mildly enlarged but stable. Aortic calcifications are seen. Linear scarring is noted in the bases bilaterally. No focal infiltrate or sizable effusion is seen. Changes of prior vertebral augmentation are noted. IMPRESSION: No acute abnormality noted. Electronically Signed   By: Inez Catalina M.D.   On: 04/29/2021 17:08    Procedures Procedures   Medications Ordered in ED Medications  aspirin tablet 325 mg (has no administration in time range)  nitroGLYCERIN (NITROSTAT) SL tablet 0.4 mg (has no administration in time range)    ED Course  I have reviewed the triage vital signs and the nursing notes.  Pertinent labs & imaging results that were available during my care of the patient were reviewed by me and considered in my medical decision making (see chart for details).    MDM Rules/Calculators/A&P                           Initial labs are normal appearing, troponin is normal at 10.  EKG shows sinus rhythm no ST elevations or depressions noted.  Q waves in the lateral leads.  Rate is normal.  Patient still complains of persistent chest tightness.  Given aspirin and nitroglycerin.  Review of prior records shows his last cardiac catheterization in April 2021, showing moderate to severe stenosis in the LAD 50 to 60%.  Hospitalists have been consulted for admission.  Cardiology consultation requested.  Final Clinical Impression(s) / ED Diagnoses Final diagnoses:  Acute coronary syndrome [facility])    Rx / DC Orders ED Discharge  Orders     None        Luna Fuse, MD 05/15/21 [phone]

## 2021-04-29 NOTE — ED Triage Notes (Signed)
Chest pain with nausea onset today

## 2021-04-30 ENCOUNTER — Observation Stay (HOSPITAL_BASED_OUTPATIENT_CLINIC_OR_DEPARTMENT_OTHER): Payer: Medicare Other

## 2021-04-30 DIAGNOSIS — E785 Hyperlipidemia, unspecified: Secondary | ICD-10-CM

## 2021-04-30 DIAGNOSIS — R079 Chest pain, unspecified: Secondary | ICD-10-CM

## 2021-04-30 DIAGNOSIS — I2510 Atherosclerotic heart disease of native coronary artery without angina pectoris: Secondary | ICD-10-CM

## 2021-04-30 DIAGNOSIS — K219 Gastro-esophageal reflux disease without esophagitis: Secondary | ICD-10-CM | POA: Diagnosis not present

## 2021-04-30 DIAGNOSIS — I10 Essential (primary) hypertension: Secondary | ICD-10-CM

## 2021-04-30 LAB — ECHOCARDIOGRAM LIMITED
Calc EF: 57.9 %
Height: 70 in
S' Lateral: 3.1 cm
Single Plane A2C EF: 57.3 %
Single Plane A4C EF: 59.4 %
Weight: 3259.28 oz

## 2021-04-30 LAB — BASIC METABOLIC PANEL WITH GFR
Anion gap: 8 (ref 5–15)
BUN: 17 mg/dL (ref 8–23)
CO2: 28 mmol/L (ref 22–32)
Calcium: 9.3 mg/dL (ref 8.9–10.3)
Chloride: 103 mmol/L (ref 98–111)
Creatinine, Ser: 1.26 mg/dL — ABNORMAL HIGH (ref 0.61–1.24)
GFR, Estimated: 56 mL/min — ABNORMAL LOW
Glucose, Bld: 102 mg/dL — ABNORMAL HIGH (ref 70–99)
Potassium: 3.9 mmol/L (ref 3.5–5.1)
Sodium: 139 mmol/L (ref 135–145)

## 2021-04-30 LAB — CBC
HCT: 39 % (ref 39.0–52.0)
Hemoglobin: 12.8 g/dL — ABNORMAL LOW (ref 13.0–17.0)
MCH: 31.1 pg (ref 26.0–34.0)
MCHC: 32.8 g/dL (ref 30.0–36.0)
MCV: 94.7 fL (ref 80.0–100.0)
Platelets: 203 K/uL (ref 150–400)
RBC: 4.12 MIL/uL — ABNORMAL LOW (ref 4.22–5.81)
RDW: 15.8 % — ABNORMAL HIGH (ref 11.5–15.5)
WBC: 7.6 K/uL (ref 4.0–10.5)
nRBC: 0 % (ref 0.0–0.2)

## 2021-04-30 MED ORDER — ISOSORBIDE MONONITRATE ER 30 MG PO TB24: 15.0000 mg | ORAL_TABLET | Freq: Every day | ORAL | 1 refills | Status: DC

## 2021-04-30 NOTE — Consult Note (Addendum)
Cardiology Consultation:   Patient ID: Mitchell [name] MRN: [id number]; DOB: 1934-01-16  Admit date: 04/29/2021 Date of Consult: 04/30/2021  PCP:  Eber Hong, MD   [facility] Providers Cardiologist:  Rozann Lesches, MD        Patient Profile:   Mitchell [name] is a 87 y.o. male with a hx of mod CAD at cath 2021, HTN, HLD, pre-DM, GERD, prostate CA, who is being seen 04/30/2021 for the evaluation of chest pain at the request of Dr Dyann Kief.  History of Present Illness:   Mitchell [name] gets an injection for his prostate CA every 6 months, last in April 2022. Otherwise, he has been doing pretty well. He did have kyphoplasty on T12 on 08/04. He has been having muscular problems related to that, he is scheduled for PT eval.   He has not been having chest pain until the current episode.   He developed chest pain started before he went to bed on 09/18. He could not get to sleep. No change w/ different positions. Worse w/ deep inspiration then, not now. Leaning forward may have helped a little. He did not try any medications. He felt a little SOB w/ the pain, had some diaphoresis, no N&V.  The next day, he called his PCP >> was told to go to the hospital and did so.   In the hospital, SL ntg x 1 helped, also got ASA. Pain has eased up.  Pain initially 8-9/10, now a 2-3/10. No longer worse w/ deep inspiration. +chest wall tenderness.   Past Medical History:  Diagnosis Date   Allergic rhinitis    CAD (coronary artery disease)    Moderate, nonobstructive disease   Dupuytren's contracture    Right hand   Essential hypertension    GERD (gastroesophageal reflux disease)    Gout    Herpes zoster    twice   Hyperlipidemia    Idiopathic scoliosis in adult patient    Inguinal hernia    Right sided   Internal hemorrhoids    Lumbosacral spondylosis    Neuropathy    Pre-diabetes    Prostate cancer ([place])    with bone mets- has been treated with chemo    Past Surgical History:  Procedure  Laterality Date   EYE SURGERY Bilateral    cataract   HERNIA REPAIR Bilateral    INTRAVASCULAR PRESSURE WIRE/FFR STUDY N/A 12/08/2019   Procedure: INTRAVASCULAR PRESSURE WIRE/FFR STUDY;  Surgeon: Leonie Man, MD;  Location: [place] [facility];  Service: Cardiovascular;  Laterality: N/A;   KYPHOPLASTY N/A 03/14/2021   Procedure: Thoracic Twelve  KYPHOPLASTY;  Surgeon: Erline Levine, MD;  Location: [place];  Service: Neurosurgery;  Laterality: N/A;  3C/RM 21   LEFT HEART CATH AND CORONARY ANGIOGRAPHY N/A 12/08/2019   Procedure: LEFT HEART CATH AND CORONARY ANGIOGRAPHY;  Surgeon: Leonie Man, MD;  Location: [place] [facility];  Service: Cardiovascular;  Laterality: N/A;   TYMPANIC MEMBRANE REPAIR     VASECTOMY       Home Medications:  Prior to Admission medications   Medication Sig Start Date End Date Taking? Authorizing Provider  acetaminophen (TYLENOL) 500 MG tablet Take 1,000 mg by mouth every 6 (six) hours as needed.   Yes [provider]  albuterol (VENTOLIN HFA) 108 (90 Base) MCG/ACT inhaler Inhale 1-2 puffs into the lungs every 6 (six) hours as needed for wheezing or shortness of breath.   Yes [provider]  allopurinol (ZYLOPRIM) 100 MG tablet Take  200 mg by mouth daily.    Yes [provider]  amLODipine (NORVASC) 2.5 MG tablet Take 2.5 mg by mouth daily.   Yes [provider]  aspirin EC 81 MG tablet Take 81 mg by mouth daily.   Yes [provider]  atorvastatin (LIPITOR) 10 MG tablet TAKE 1 TABLET BY MOUTH EVERY DAY Patient taking differently: Take 10 mg by mouth daily. 12/17/20  Yes Verta Ellen., NP  bisoprolol (ZEBETA) 5 MG tablet TAKE 1 TABLET BY MOUTH EVERY DAY 03/25/21  Yes Satira Sark, MD  colchicine 0.6 MG tablet Take 0.6 mg by mouth 2 (two) times daily as needed (gout).   Yes [provider]  furosemide (LASIX) 20 MG tablet Take 20 mg by mouth.   Yes [provider]  gabapentin (NEURONTIN) 300  MG capsule Take 600 mg by mouth 3 (three) times daily.    Yes [provider]  hydrochlorothiazide (HYDRODIURIL) 25 MG tablet TAKE 1 TABLET BY MOUTH EVERY DAY Patient taking differently: Take 25 mg by mouth 2 (two) times daily. 01/22/21  Yes Satira Sark, MD  hydrocortisone 2.5 % cream Apply 1 application topically 2 (two) times daily as needed (irritation).   Yes [provider]  ketoconazole (NIZORAL) 2 % cream Apply topically 2 (two) times daily. 03/18/21  Yes [provider]  leuprolide, 6 Month, (ELIGARD) 45 MG injection Inject 45 mg into the skin every 6 (six) months.   Yes [provider]  Multiple Vitamins-Minerals (MULTIVITAMIN WITH MINERALS) tablet Take 1 tablet by mouth daily.   Yes [provider]  predniSONE (DELTASONE) 5 MG tablet Take 5 mg by mouth daily with breakfast.   Yes [provider]  Propylene Glycol (SYSTANE BALANCE) 0.6 % SOLN Place 1 drop into both eyes daily as needed (dry eyes).   Yes [provider]  traMADol (ULTRAM) 50 MG tablet Take 25 mg by mouth every 8 (eight) hours as needed for moderate pain.   Yes [provider]  fluticasone (FLONASE) 50 MCG/ACT nasal spray Place 2 sprays into both nostrils daily as needed for allergies or rhinitis.    [provider]  ibuprofen (ADVIL) 600 MG tablet Take 600 mg by mouth every 6 (six) hours as needed for moderate pain. Patient not taking: Reported on 04/29/2021    [provider]  meclizine (ANTIVERT) 25 MG tablet Take 25 mg by mouth 3 (three) times daily as needed for dizziness. Patient not taking: Reported on 04/29/2021    [provider]  meloxicam (MOBIC) 7.5 MG tablet Take 7.5 mg by mouth daily. Patient not taking: No sig reported 03/11/21   [provider]    Inpatient Medications: Scheduled Meds:  allopurinol  200 mg Oral Daily   amLODipine  2.5 mg Oral Daily   aspirin EC  81 mg Oral Daily   atorvastatin  10  mg Oral Daily   bisoprolol  5 mg Oral Daily   enoxaparin (LOVENOX) injection  40 mg Subcutaneous Q24H   feeding supplement  237 mL Oral BID BM   gabapentin  600 mg Oral TID   predniSONE  5 mg Oral Q breakfast   Continuous Infusions:  PRN Meds: acetaminophen **OR** acetaminophen, ondansetron **OR** ondansetron (ZOFRAN) IV, polyethylene glycol  Allergies:    Allergies  Allergen Reactions   Lisinopril Cough   Losartan Cough   Penicillins Rash    Social History:   Social History   Socioeconomic History   Marital status:  Married    Spouse name: Not on file   Number of children: 3   Years of education: 14   Highest education level: Not on file  Occupational History   Not on file  Tobacco Use   Smoking status: Never   Smokeless tobacco: Never  Vaping Use   Vaping Use: Never used  Substance and Sexual Activity   Alcohol use: Not Currently    Alcohol/week: 0.0 - 1.0 standard drinks    Comment: "at best"   Drug use: Never   Sexual activity: Not on file  Other Topics Concern   Not on file  Social History Narrative   Lives with wife at home    Right handed   Caffeine: 1 cup/day   Social Determinants of Health   Financial Resource Strain: Not on file  Food Insecurity: Not on file  Transportation Needs: Not on file  Physical Activity: Not on file  Stress: Not on file  Social Connections: Not on file  Intimate Partner Violence: Not on file    Family History:   Family History  Problem Relation Age of Onset   Heart failure Mother    Heart failure Father    CAD Father    Cancer Sister    CAD Brother    Stroke Brother    Neuropathy Neg Hx      ROS:  Please see the history of present illness.  All other ROS reviewed and negative.     Physical Exam/Data:   Vitals:   04/29/21 2032 04/29/21 2324 04/30/21 0326 04/30/21 0543  BP: 130/69 (!) 101/54 102/83 (!) 108/50  Pulse: (!) 102 75 89 75  Resp: 19 18 20 20   Temp: 98.2 F (36.8 C) 98.2 F (36.8 C) 98.2 F  (36.8 C) 98.9 F (37.2 C)  TempSrc: Oral Oral Oral Oral  SpO2: 95% 93% 92% 91%  Weight:      Height:       No intake or output data in the 24 hours ending 04/30/21 0856 Last 3 Weights 04/29/2021 04/29/2021 03/14/2021  Weight (lbs) 203 lb 11.3 oz 207 lb 3.7 oz 205 lb  Weight (kg) 92.4 kg 94 kg 92.987 kg     Body mass index is 29.23 kg/m.  General:  Well nourished, well developed, in no acute distress HEENT: normal Neck: no JVD Vascular: No carotid bruits; Distal pulses 2+ bilaterally Cardiac:  normal S1, S2; RRR; no murmur  Lungs:  clear to auscultation bilaterally, no wheezing, rhonchi or rales, +chest wall tenderness Abd: soft, nontender, no hepatomegaly  Ext: no edema Musculoskeletal:  No deformities, BUE and BLE strength weak but equal, has exertional tremor Skin: warm and dry  Neuro:  CNs 2-12 intact, no focal abnormalities noted Psych:  Normal affect   EKG:  The EKG was personally reviewed and demonstrates:  ST, HR 106, PR 222, inverted T waves in III and borderline ST depression in I are unchanged Telemetry:  Telemetry was personally reviewed and demonstrates:  SR, PAC - nonconducted  Relevant CV Studies:  ECHO: 04/30/2021  1. Left ventricular ejection fraction, by estimation, is 55 to 60%. The  left ventricle has normal function. The left ventricle has no regional  wall motion abnormalities. There is mild asymmetric left ventricular  hypertrophy of the septal segment. Left  ventricular diastolic parameters are indeterminate.   2. Right ventricular systolic function is normal. The right ventricular  size is normal. Tricuspid regurgitation signal is inadequate for assessing  PA pressure.   3.  The mitral valve is degenerative. Trivial mitral valve regurgitation.   4. The aortic valve is tricuspid. Aortic valve regurgitation is trivial.  Mild to moderate aortic valve sclerosis/calcification is present, without  any evidence of aortic stenosis. Aortic regurgitation PHT  measures 1175  msec.   5. Unable to estimate CVP.   Comparison(s): Echocardiogram done 11/11/19 showed an EF of 50-55%.   CARDIAC CATH: 12/08/2019 The left ventricular systolic function is normal. The left ventricular ejection fraction is 50-55% by visual estimate. LV end diastolic pressure is moderately elevated. There is no aortic valve stenosis. Prox LAD lesion is 55% stenosed with 60% stenosed side Naysha Sholl in 1st Diag. Both lesions were tested with RFR/DFR and found to be not physiologically significant. Dist LAD lesion is 60% stenosed.   Angiographically moderate-severe single-vessel disease with ostial LAD eccentrically calcified 55-60% stenosis at takeoff of high branching 1st Diag -> RFR/DFR negative for both LAD and Diag; mid LAD focal 60% and hinge point Otherwise minimal CAD in a right dominant system. Normal LVEF and mild-moderately elevated EDP.   Angiographic imaging correlates with noninvasive nuclear stress test imaging. ->  No obvious culprit lesion.  Despite having lesions in the LAD, there was no evidence of anterior ischemia on the Myoview, this correlates with DFR/FFR results. Diagnostic Dominance: Right   Laboratory Data:  High Sensitivity Troponin:   Recent Labs  Lab 04/29/21 1611 04/29/21 1756  TROPONINIHS 10 9     Chemistry Recent Labs  Lab 04/29/21 1611 04/29/21 1612 04/30/21 0511  NA 138  --  139  K 3.3*  --  3.9  CL 104  --  103  CO2 24  --  28  GLUCOSE 122*  --  102*  BUN 20  --  17  CREATININE 1.14  --  1.26*  CALCIUM 9.4  --  9.3  MG  --  1.7  --   GFRNONAA >60  --  56*  ANIONGAP 10  --  8    No results for input(s): PROT, ALBUMIN, AST, ALT, ALKPHOS, BILITOT in the last 168 hours. Lipids No results for input(s): CHOL, TRIG, HDL, LABVLDL, LDLCALC, CHOLHDL in the last 168 hours.  Hematology Recent Labs  Lab 04/29/21 1611 04/30/21 0511  WBC 9.7 7.6  RBC 4.82 4.12*  HGB 14.6 12.8*  HCT 44.7 39.0  MCV 92.7 94.7  MCH 30.3 31.1  MCHC  32.7 32.8  RDW 15.6* 15.8*  PLT 246 203   Thyroid No results for input(s): TSH, FREET4 in the last 168 hours.  BNP Recent Labs  Lab 04/29/21 1611  BNP 99.0    DDimer  Recent Labs  Lab 04/29/21 1611  DDIMER 1.02*     Radiology/Studies:  DG Chest 2 View  Result Date: 04/29/2021 CLINICAL DATA:  Chest pain and shortness of breath for several hours EXAM: CHEST - 2 VIEW COMPARISON:  01/01/2021 FINDINGS: Cardiac shadow is mildly enlarged but stable. Aortic calcifications are seen. Linear scarring is noted in the bases bilaterally. No focal infiltrate or sizable effusion is seen. Changes of prior vertebral augmentation are noted. IMPRESSION: No acute abnormality noted. Electronically Signed   By: Inez Catalina M.D.   On: 04/29/2021 17:08   CT Angio Chest Pulmonary Embolism (PE) W or WO Contrast  Result Date: 04/29/2021 CLINICAL DATA:  Chest pain with difficulty breathing, tachycardia, lower extremity swelling. EXAM: CT ANGIOGRAPHY CHEST WITH CONTRAST TECHNIQUE: Multidetector CT imaging of the chest was performed using the standard protocol during bolus administration of intravenous contrast.  Multiplanar CT image reconstructions and MIPs were obtained to evaluate the vascular anatomy. CONTRAST:  139mL OMNIPAQUE IOHEXOL 350 MG/ML SOLN COMPARISON:  Radiograph earlier today.  Chest CT 01/01/2013 FINDINGS: Cardiovascular: There are no filling defects within the pulmonary arteries to suggest pulmonary embolus. Borderline cardiomegaly. There are coronary artery calcification. Thoracic aorta is tortuous. Diffuse calcified and noncalcified atheromatous plaque. Plaque in the descending thoracic aorta is diffusely irregular. No evidence of acute aortic syndrome or penetrating ulcer. No periaortic stranding. Pericardial effusion. Mediastinum/Nodes: Calcified right hilar lymph nodes consistent with prior granulomatous disease. Stable prominent anterior mediastinal lymph node measuring 13 mm, series 4, image 33.  No additional enlarged lymph nodes. Tiny hiatal hernia. No suspicious thyroid nodule. Lungs/Pleura: Breathing motion artifact limits detailed assessment. Linear atelectasis or scarring in the anterior right upper lobe. Heterogeneous pulmonary parenchyma. Dependent atelectasis in the lower lobes, right greater than left. No confluent airspace disease or pneumonia. Right lower lobe calcified granuloma. No pleural fluid. No evidence of pulmonary edema. No pulmonary mass. Upper Abdomen: Calcified granuloma in the spleen. No acute upper abdominal findings. Musculoskeletal: Remote fractures of right anterior lower ribs. Sclerotic focus within the left anterior fifth rib was not seen on prior exam. There is a subacute or remote fracture of the left anterior sixth rib with surrounding callus formation. Stable appearance of T4 superior endplate compression fracture. The previous T12 compression fracture is not included in the field of view, however there may be kyphoplasty material. Mild diffuse degenerative change in the spine. Review of the MIP images confirms the above findings. IMPRESSION: 1. No pulmonary embolus. 2. Heterogeneous pulmonary parenchyma, can be seen with small vessel or small airways disease. 3. Aortic atherosclerosis, with irregular plaque in the descending thoracic aorta, similar to prior exam. No acute aortic findings. 4. Subacute or remote left anterior sixth rib fracture with surrounding callus formation. There is a sclerotic focus within the adjacent anterior left fifth rib, favor prior fracture given fracture and adjacent rib. Possibility of sclerotic metastasis is not entirely excluded in this patient with prostate cancer. 5. Remote right anterior lower rib fractures. Stable T4 superior endplate compression fracture. Aortic Atherosclerosis (ICD10-I70.0). Electronically Signed   By: Keith Rake M.D.   On: 04/29/2021 21:36    CT CHEST PE PROTOCOL: 04/29/2021 IMPRESSION: 1. No pulmonary  embolus. 2. Heterogeneous pulmonary parenchyma, can be seen with small vessel or small airways disease. 3. Aortic atherosclerosis, with irregular plaque in the descending thoracic aorta, similar to prior exam. No acute aortic findings. 4. Subacute or remote left anterior sixth rib fracture with surrounding callus formation. There is a sclerotic focus within the adjacent anterior left fifth rib, favor prior fracture given fracture and adjacent rib. Possibility of sclerotic metastasis is not entirely excluded in this patient with prostate cancer. 5. Remote right anterior lower rib fractures. Stable T4 superior endplate compression fracture.  Assessment and Plan:   Chest pain - mostly atypical sx w/ chest wall tenderness, at first pain was better leaning forward and worse w/ deep inspiration - ez neg MI despite > 24 hr of pain - CT w/ old rib fx, sclerotic focus in L 5th rib and prev R lower rib fx >> may be substrate for MS pain - however, pt has known moderate CAD and sx improved w/nitro - MD advise on repeat cath vs MV or defer ischemic eval and rx for inflammatory/musculoskeletal pain  Otherwise, per IM   Risk Assessment/Risk Scores:     HEAR Score (for undifferentiated chest pain):  HEAR Score: 5    For questions or updates, please contact [place] Please consult [email or web address] for contact info under    Signed, Rosaria Ferries, PA-C  04/30/2021 8:56 AM Attending note  Patient seen and discussed with PA Mitchell [name], I agree with her documentation. 87 yo male history of CAD, HTN, prostate cancer presented with chest pain. Pain started Sunday night while in bed. Combined pressure/dull pain midchest with some SOB. Lasted all night long into the next day. The next day started radiating down both arms. Not positional, not related to food or eating.    K 3.3 BUN 20 Cr 1.14 WBC 9.7 Hgb 14.6 Plt 246 Ddimer 1 BNP 99 Mg 1.7 Trop 10-->9 EKG SR, no acute ischemic changes CT PE: no  PE CXR no acute process COVID neg   01/2021 echo: LVE 55-60%, no WMAs, normal RV 11/2019 nuclear stress: no ischemia 11/2019 cath: prox LAD 55%, D1 60%, no significant by RFR/DFR, distal LAD 60%   Patient presents with atypical chest pain. Over 12 hours of pain constant though variable in severity. Extensive workup just last year with nuclear stress and cath without evidence of obstructive disease. In absence of objective evidence of ischemia would not plan to repeat ischemic testing at this time. Obtain limited echo, if benign no further cardiac workup, could consider adding low dose imdur as trial   Carlyle Dolly MD

## 2021-04-30 NOTE — Care Management Important Message (Deleted)
Important Message  Patient Details  Name: Mitchell [name] MRN: [id number] Date of Birth: 08/08/1934   Medicare Important Message Given:  Yes     Tommy Medal 04/30/2021, 4:08 PM

## 2021-04-30 NOTE — Discharge Summary (Signed)
Physician Discharge Summary  Mitchell [name] ZWC:[id number] DOB: 1933/09/07 DOA: 04/29/2021  PCP: Eber Hong, MD  Admit date: 04/29/2021 Discharge date: 04/30/2021  Time spent: 35 minutes  Recommendations for Outpatient Follow-up:  Reassess resolution/response to chest discomfort Outpatient follow-up with cardiology as instructed Reassess blood pressure and adjust antihypertensive regimen as needed Repeat basic metabolic panel to evaluate lites and renal function.  Discharge Diagnoses:  Principal Problem:   Chest pain Active Problems:   Benign essential hypertension   Coronary artery disease involving native coronary artery of native heart without angina pectoris   Gastroesophageal reflux disease   Hyperlipidemia   Discharge Condition: Stable and improved.  Discharged home with instruction to follow-up with PCP and cardiology service as an outpatient.  CODE STATUS: Full code.  Diet recommendation: Heart healthy/low-sodium diet.  Filed Weights   04/29/21 1637 04/29/21 1930  Weight: 94 kg 92.4 kg    History of present illness:  As per H&P written by Dr. Denton Brick on 04/29/2021 Mitchell [name] is a 87 y.o. male with medical history significant for  CAD, HTN, DM, Prostate cancer.  Patient presented to the Ed with c/o chest pain that started last night while he was resting.  Chest pain was right sided, with associated difficulty breathing and nausea, radiating down his left and the his right arms. He had some diaphoresis earlier before onset of chest pain.  He reports chronic bilateral lower extremity swelling for which he takes Lasix, but this has not helped.   ED Course: Tachycardic- 102 to 109, respiratory rate 17-23.  Blood pressure systolic 235-361.  O2 sats 92 to 97% on room air.  Troponins 10>9.  EKG shows sinus tachycardia with first-degree AV block no significant change from prior.  Potassium 3.3. D-dimer was checked and elevated at 1.02.  Two-view chest x-ray without acute  abnormality. EDP talked to cardiologist on-call at [facility] Dr. Marisue Ivan, recommended admitting here if troponins are within normal limits.   Hospital Course:  1-chest pain -With known nonobstructive coronary artery disease and heart score of 5 -Negative troponin and no acute ischemic changes on EKG -2D echo reassuring with preserved ejection fraction and no wall motion abnormalities. -Case discussed with cardiology service who has recommended trial of low-dose imdur and outpatient follow-up; no further ischemic work-up while inpatient.  2-chronic diastolic heart failure -Compensated, patient denies orthopnea and dyspnea on exertion -Some chronic lower extremity edema appreciated (Unchanged according to patient description) -Continue home diuretic regimen and the use of low-sodium diet. -Advised to follow daily weights and strict I's and O's.  3-history of gout -Resume the use of daily allopurinol -Continue as needed colchicine.  4-history of seronegative rheumatoid arthritis -Stable overall -Continue outpatient follow-up with rheumatologist. -Continue the use of low-dose prednisone (5 mg daily).  5-gastroesophageal flux disease -Continue PPI  6-hypertension -Continue the use of bisoprolol and low-dose Norvasc -Patient advised to follow heart healthy diet. -We will follow response of vital signs after addition of low-dose Imdur  7-hyperlipidemia -Continue statins  Procedures: 2D echo 1. Limited study.   2. Left ventricular ejection fraction, by estimation, is 55 to 60%. The  left ventricle has normal function. The left ventricle has no regional  wall motion abnormalities. There is moderate asymmetric left ventricular  hypertrophy of the septal segment.  Left ventricular diastolic function could not be evaluated.   3. Right ventricular systolic function is normal. The right ventricular  size is normal.   4. The mitral valve is abnormal, mildly calcified and with  mild  annular  calcification.   5. The aortic valve is tricuspid. There is moderate calcification of the  aortic valve. Mild to moderate aortic valve sclerosis/calcification is  present, without any evidence of aortic stenosis.   6. Unable to estimate CVP.   Consultations: Cardiology service  Discharge Exam: Vitals:   04/30/21 0543 04/30/21 1358  BP: (!) 108/50 (!) 106/57  Pulse: 75 67  Resp: 20 17  Temp: 98.9 F (37.2 C) 98.6 F (37 C)  SpO2: 91% 92%    General: Expressing mild chest soreness, no shortness of breath, no nausea, no vomiting, no palpitations.  Patient denies orthopnea.   Cardiovascular: Regular rate, no rubs, no gallops, no JVD on exam.  Trace edema appreciated bilaterally in his lower extremities. Respiratory: Good air movement bilaterally, no using accessory muscle.  Good saturation on room air Abdomen: Soft, nontender, nondistended, positive bowel sounds. Extremities: No cyanosis or clubbing.  Discharge Instructions   Discharge Instructions     Diet - low sodium heart healthy   Complete by: As directed    Discharge instructions   Complete by: As directed    Maintain adequate hydration Take medications as prescribed Follow-up with cardiology service as instructed Arrange follow-up with PCP in 10 days. Follow heart healthy diet.   Increase activity slowly   Complete by: As directed       Allergies as of 04/30/2021       Reactions   Lisinopril Cough   Losartan Cough   Penicillins Rash        Medication List     STOP taking these medications    furosemide 20 MG tablet Commonly known as: LASIX   ibuprofen 600 MG tablet Commonly known as: ADVIL   meloxicam 7.5 MG tablet Commonly known as: MOBIC       TAKE these medications    acetaminophen 500 MG tablet Commonly known as: TYLENOL Take 1,000 mg by mouth every 6 (six) hours as needed.   albuterol 108 (90 Base) MCG/ACT inhaler Commonly known as: VENTOLIN HFA Inhale 1-2 puffs into  the lungs every 6 (six) hours as needed for wheezing or shortness of breath.   allopurinol 100 MG tablet Commonly known as: ZYLOPRIM Take 200 mg by mouth daily.   amLODipine 2.5 MG tablet Commonly known as: NORVASC Take 2.5 mg by mouth daily.   aspirin EC 81 MG tablet Take 81 mg by mouth daily.   atorvastatin 10 MG tablet Commonly known as: LIPITOR TAKE 1 TABLET BY MOUTH EVERY DAY   bisoprolol 5 MG tablet Commonly known as: ZEBETA TAKE 1 TABLET BY MOUTH EVERY DAY   colchicine 0.6 MG tablet Take 0.6 mg by mouth 2 (two) times daily as needed (gout).   fluticasone 50 MCG/ACT nasal spray Commonly known as: FLONASE Place 2 sprays into both nostrils daily as needed for allergies or rhinitis.   gabapentin 300 MG capsule Commonly known as: NEURONTIN Take 600 mg by mouth 3 (three) times daily.   hydrochlorothiazide 25 MG tablet Commonly known as: HYDRODIURIL TAKE 1 TABLET BY MOUTH EVERY DAY What changed: when to take this   hydrocortisone 2.5 % cream Apply 1 application topically 2 (two) times daily as needed (irritation).   isosorbide mononitrate 30 MG 24 hr tablet Commonly known as: IMDUR Take 0.5 tablets (15 mg total) by mouth daily.   ketoconazole 2 % cream Commonly known as: NIZORAL Apply topically 2 (two) times daily.   leuprolide (6 Month) 45 MG injection Commonly known as: ELIGARD  Inject 45 mg into the skin every 6 (six) months.   meclizine 25 MG tablet Commonly known as: ANTIVERT Take 25 mg by mouth 3 (three) times daily as needed for dizziness.   multivitamin with minerals tablet Take 1 tablet by mouth daily.   predniSONE 5 MG tablet Commonly known as: DELTASONE Take 5 mg by mouth daily with breakfast.   Systane Balance 0.6 % Soln Generic drug: Propylene Glycol Place 1 drop into both eyes daily as needed (dry eyes).   traMADol 50 MG tablet Commonly known as: ULTRAM Take 25 mg by mouth every 8 (eight) hours as needed for moderate pain.        Allergies  Allergen Reactions   Lisinopril Cough   Losartan Cough   Penicillins Rash    Follow-up Information     Eber Hong, MD. Schedule an appointment as soon as possible for a visit in 10 day(s).   Specialty: Internal Medicine Contact information: [address] [place] [address] [phone]         Satira Sark, MD .   Specialty: Cardiology Contact information: [place] [address] [phone]                 The results of significant diagnostics from this hospitalization (including imaging, microbiology, ancillary and laboratory) are listed below for reference.    Significant Diagnostic Studies: DG Chest 2 View  Result Date: 04/29/2021 CLINICAL DATA:  Chest pain and shortness of breath for several hours EXAM: CHEST - 2 VIEW COMPARISON:  01/01/2021 FINDINGS: Cardiac shadow is mildly enlarged but stable. Aortic calcifications are seen. Linear scarring is noted in the bases bilaterally. No focal infiltrate or sizable effusion is seen. Changes of prior vertebral augmentation are noted. IMPRESSION: No acute abnormality noted. Electronically Signed   By: Inez Catalina M.D.   On: 04/29/2021 17:08   CT Angio Chest Pulmonary Embolism (PE) W or WO Contrast  Result Date: 04/29/2021 CLINICAL DATA:  Chest pain with difficulty breathing, tachycardia, lower extremity swelling. EXAM: CT ANGIOGRAPHY CHEST WITH CONTRAST TECHNIQUE: Multidetector CT imaging of the chest was performed using the standard protocol during bolus administration of intravenous contrast. Multiplanar CT image reconstructions and MIPs were obtained to evaluate the vascular anatomy. CONTRAST:  151mL OMNIPAQUE IOHEXOL 350 MG/ML SOLN COMPARISON:  Radiograph earlier today.  Chest CT 01/01/2013 FINDINGS: Cardiovascular: There are no filling defects within the pulmonary arteries to suggest pulmonary embolus. Borderline cardiomegaly. There are coronary artery calcification.  Thoracic aorta is tortuous. Diffuse calcified and noncalcified atheromatous plaque. Plaque in the descending thoracic aorta is diffusely irregular. No evidence of acute aortic syndrome or penetrating ulcer. No periaortic stranding. Pericardial effusion. Mediastinum/Nodes: Calcified right hilar lymph nodes consistent with prior granulomatous disease. Stable prominent anterior mediastinal lymph node measuring 13 mm, series 4, image 33. No additional enlarged lymph nodes. Tiny hiatal hernia. No suspicious thyroid nodule. Lungs/Pleura: Breathing motion artifact limits detailed assessment. Linear atelectasis or scarring in the anterior right upper lobe. Heterogeneous pulmonary parenchyma. Dependent atelectasis in the lower lobes, right greater than left. No confluent airspace disease or pneumonia. Right lower lobe calcified granuloma. No pleural fluid. No evidence of pulmonary edema. No pulmonary mass. Upper Abdomen: Calcified granuloma in the spleen. No acute upper abdominal findings. Musculoskeletal: Remote fractures of right anterior lower ribs. Sclerotic focus within the left anterior fifth rib was not seen on prior exam. There is a subacute or remote fracture of the left anterior sixth rib with surrounding callus  formation. Stable appearance of T4 superior endplate compression fracture. The previous T12 compression fracture is not included in the field of view, however there may be kyphoplasty material. Mild diffuse degenerative change in the spine. Review of the MIP images confirms the above findings. IMPRESSION: 1. No pulmonary embolus. 2. Heterogeneous pulmonary parenchyma, can be seen with small vessel or small airways disease. 3. Aortic atherosclerosis, with irregular plaque in the descending thoracic aorta, similar to prior exam. No acute aortic findings. 4. Subacute or remote left anterior sixth rib fracture with surrounding callus formation. There is a sclerotic focus within the adjacent anterior left fifth  rib, favor prior fracture given fracture and adjacent rib. Possibility of sclerotic metastasis is not entirely excluded in this patient with prostate cancer. 5. Remote right anterior lower rib fractures. Stable T4 superior endplate compression fracture. Aortic Atherosclerosis (ICD10-I70.0). Electronically Signed   By: Keith Rake M.D.   On: 04/29/2021 21:36   ECHOCARDIOGRAM LIMITED  Result Date: 04/30/2021    ECHOCARDIOGRAM LIMITED REPORT   Patient Name:   ELISHUA RADFORD Date of Exam: 04/30/2021 Medical Rec #:  [id number]    Height:       70.0 in Accession #:    [id number]   Weight:       203.7 lb Date of Birth:  July 12, 1934     BSA:          2.104 m Patient Age:    87 years     BP:           108/50 mmHg Patient Gender: M            HR:           75 bpm. Exam Location:  [facility] Procedure: Limited Echo Indications:    Chest Pain  History:        Patient has prior history of Echocardiogram examinations, most                 recent 01/16/2021. CAD, Arrythmias:PVC and PAC,                 Signs/Symptoms:Chest Pain and Dyspnea; Risk Factors:Hypertension                 and Dyslipidemia.  Sonographer:    Wenda Low Referring Phys: [id number] [place]  1. Limited study.  2. Left ventricular ejection fraction, by estimation, is 55 to 60%. The left ventricle has normal function. The left ventricle has no regional wall motion abnormalities. There is moderate asymmetric left ventricular hypertrophy of the septal segment. Left ventricular diastolic function could not be evaluated.  3. Right ventricular systolic function is normal. The right ventricular size is normal.  4. The mitral valve is abnormal, mildly calcified and with mild annular calcification.  5. The aortic valve is tricuspid. There is moderate calcification of the aortic valve. Mild to moderate aortic valve sclerosis/calcification is present, without any evidence of aortic stenosis.  6. Unable to estimate CVP. Comparison(s): Prior images  reviewed side by side. LVEF normal range. No pericardial effusion. FINDINGS  Left Ventricle: Left ventricular ejection fraction, by estimation, is 55 to 60%. The left ventricle has normal function. The left ventricle has no regional wall motion abnormalities. The left ventricular internal cavity size was normal in size. There is  moderate asymmetric left ventricular hypertrophy of the septal segment. Left ventricular diastolic function could not be evaluated. Right Ventricle: The right ventricular size is normal. Right ventricular systolic function is normal.  Pericardium: There is no evidence of pericardial effusion. Mitral Valve: The mitral valve is abnormal. There is mild calcification of the mitral valve leaflet(s). Mild mitral annular calcification. Aortic Valve: The aortic valve is tricuspid. There is moderate calcification of the aortic valve. There is mild aortic valve annular calcification. Mild to moderate aortic valve sclerosis/calcification is present, without any evidence of aortic stenosis. Aorta: The aortic root is normal in size and structure. Venous: Unable to estimate CVP. The inferior vena cava was not well visualized. IAS/Shunts: No atrial level shunt detected by color flow Doppler. LEFT VENTRICLE PLAX 2D LVIDd:         4.50 cm LVIDs:         3.10 cm LV PW:         1.30 cm LV IVS:        1.10 cm LVOT diam:     1.90 cm LVOT Area:     2.84 cm  LV Volumes (MOD) LV vol d, MOD A2C: 68.6 ml LV vol d, MOD A4C: 77.6 ml LV vol s, MOD A2C: 29.3 ml LV vol s, MOD A4C: 31.5 ml LV SV MOD A2C:     39.3 ml LV SV MOD A4C:     77.6 ml LV SV MOD BP:      44.8 ml LEFT ATRIUM         Index LA diam:    3.90 cm 1.85 cm/m   AORTA Ao Root diam: 3.30 cm  SHUNTS Systemic Diam: 1.90 cm Rozann Lesches MD Electronically signed by Rozann Lesches MD Signature Date/Time: 04/30/2021/4:04:17 PM    Final     Microbiology: Recent Results (from the past 240 hour(s))  Resp Panel by RT-PCR (Flu A&B, Covid) Nasopharyngeal Swab      Status: None   Collection Time: 04/29/21  5:54 PM   Specimen: Nasopharyngeal Swab; Nasopharyngeal(NP) swabs in vial transport medium  Result Value Ref Range Status   SARS Coronavirus 2 by RT PCR NEGATIVE NEGATIVE Final    Comment: (NOTE) SARS-CoV-2 target nucleic acids are NOT DETECTED.  The SARS-CoV-2 RNA is generally detectable in upper respiratory specimens during the acute phase of infection. The lowest concentration of SARS-CoV-2 viral copies this assay can detect is 138 copies/mL. A negative result does not preclude SARS-Cov-2 infection and should not be used as the sole basis for treatment or other patient management decisions. A negative result may occur with  improper specimen collection/handling, submission of specimen other than nasopharyngeal swab, presence of viral mutation(s) within the areas targeted by this assay, and inadequate number of viral copies(<138 copies/mL). A negative result must be combined with clinical observations, patient history, and epidemiological information. The expected result is Negative.  Fact Sheet for Patients:  EntrepreneurPulse.com.au  Fact Sheet for Healthcare Providers:  IncredibleEmployment.be  This test is no t yet approved or cleared by the [place] FDA and  has been authorized for detection and/or diagnosis of SARS-CoV-2 by FDA under an Emergency Use Authorization (EUA). This EUA will remain  in effect (meaning this test can be used) for the duration of the COVID-19 declaration under Section 564(b)(1) of the Act, 21 U.S.C.section 360bbb-3(b)(1), unless the authorization is terminated  or revoked sooner.       Influenza A by PCR NEGATIVE NEGATIVE Final   Influenza B by PCR NEGATIVE NEGATIVE Final    Comment: (NOTE) The Xpert Xpress SARS-CoV-2/FLU/RSV plus assay is intended as an aid in the diagnosis of influenza from Nasopharyngeal swab specimens and should not be  used as a sole basis  for treatment. Nasal washings and aspirates are unacceptable for Xpert Xpress SARS-CoV-2/FLU/RSV testing.  Fact Sheet for Patients: EntrepreneurPulse.com.au  Fact Sheet for Healthcare Providers: IncredibleEmployment.be  This test is not yet approved or cleared by the [place] FDA and has been authorized for detection and/or diagnosis of SARS-CoV-2 by FDA under an Emergency Use Authorization (EUA). This EUA will remain in effect (meaning this test can be used) for the duration of the COVID-19 declaration under Section 564(b)(1) of the Act, 21 U.S.C. section 360bbb-3(b)(1), unless the authorization is terminated or revoked.  Performed at [facility], [address], [place], [place] [address]      Labs: Basic Metabolic Panel: Recent Labs  Lab 04/29/21 1611 04/29/21 1612 04/30/21 0511  NA 138  --  139  K 3.3*  --  3.9  CL 104  --  103  CO2 24  --  28  GLUCOSE 122*  --  102*  BUN 20  --  17  CREATININE 1.14  --  1.26*  CALCIUM 9.4  --  9.3  MG  --  1.7  --    CBC: Recent Labs  Lab 04/29/21 1611 04/30/21 0511  WBC 9.7 7.6  HGB 14.6 12.8*  HCT 44.7 39.0  MCV 92.7 94.7  PLT 246 203   BNP (last 3 results) Recent Labs    04/29/21 1611  BNP 99.0   Signed:  Barton Dubois MD.  [facility] 04/30/2021, 4:25 PM

## 2021-04-30 NOTE — Progress Notes (Signed)
Nsg Discharge Note  Admit Date:  04/29/2021 Discharge date: 04/30/2021   Mitchell [name] to be D/C'd Home per MD order.  AVS completed.  Copy for chart, and copy for patient signed, and dated. Patient/caregiver able to verbalize understanding.  Discharge Medication: Allergies as of 04/30/2021       Reactions   Lisinopril Cough   Losartan Cough   Penicillins Rash        Medication List     STOP taking these medications    furosemide 20 MG tablet Commonly known as: LASIX   ibuprofen 600 MG tablet Commonly known as: ADVIL   meloxicam 7.5 MG tablet Commonly known as: MOBIC       TAKE these medications    acetaminophen 500 MG tablet Commonly known as: TYLENOL Take 1,000 mg by mouth every 6 (six) hours as needed.   albuterol 108 (90 Base) MCG/ACT inhaler Commonly known as: VENTOLIN HFA Inhale 1-2 puffs into the lungs every 6 (six) hours as needed for wheezing or shortness of breath.   allopurinol 100 MG tablet Commonly known as: ZYLOPRIM Take 200 mg by mouth daily.   amLODipine 2.5 MG tablet Commonly known as: NORVASC Take 2.5 mg by mouth daily.   aspirin EC 81 MG tablet Take 81 mg by mouth daily.   atorvastatin 10 MG tablet Commonly known as: LIPITOR TAKE 1 TABLET BY MOUTH EVERY DAY   bisoprolol 5 MG tablet Commonly known as: ZEBETA TAKE 1 TABLET BY MOUTH EVERY DAY   colchicine 0.6 MG tablet Take 0.6 mg by mouth 2 (two) times daily as needed (gout).   fluticasone 50 MCG/ACT nasal spray Commonly known as: FLONASE Place 2 sprays into both nostrils daily as needed for allergies or rhinitis.   gabapentin 300 MG capsule Commonly known as: NEURONTIN Take 600 mg by mouth 3 (three) times daily.   hydrochlorothiazide 25 MG tablet Commonly known as: HYDRODIURIL TAKE 1 TABLET BY MOUTH EVERY DAY What changed: when to take this   hydrocortisone 2.5 % cream Apply 1 application topically 2 (two) times daily as needed (irritation).   isosorbide mononitrate 30  MG 24 hr tablet Commonly known as: IMDUR Take 0.5 tablets (15 mg total) by mouth daily.   ketoconazole 2 % cream Commonly known as: NIZORAL Apply topically 2 (two) times daily.   leuprolide (6 Month) 45 MG injection Commonly known as: ELIGARD Inject 45 mg into the skin every 6 (six) months.   meclizine 25 MG tablet Commonly known as: ANTIVERT Take 25 mg by mouth 3 (three) times daily as needed for dizziness.   multivitamin with minerals tablet Take 1 tablet by mouth daily.   predniSONE 5 MG tablet Commonly known as: DELTASONE Take 5 mg by mouth daily with breakfast.   Systane Balance 0.6 % Soln Generic drug: Propylene Glycol Place 1 drop into both eyes daily as needed (dry eyes).   traMADol 50 MG tablet Commonly known as: ULTRAM Take 25 mg by mouth every 8 (eight) hours as needed for moderate pain.        Discharge Assessment: Vitals:   04/30/21 0543 04/30/21 1358  BP: (!) 108/50 (!) 106/57  Pulse: 75 67  Resp: 20 17  Temp: 98.9 F (37.2 C) 98.6 F (37 C)  SpO2: 91% 92%   Skin clean, dry and intact without evidence of skin break down, no evidence of skin tears noted. IV catheter discontinued intact. Site without signs and symptoms of complications - no redness or edema noted at insertion  site, patient denies c/o pain - only slight tenderness at site.  Dressing with slight pressure applied.  D/c Instructions-Education: Discharge instructions given to patient/family with verbalized understanding. D/c education completed with patient/family including follow up instructions, medication list, d/c activities limitations if indicated, with other d/c instructions as indicated by MD - patient able to verbalize understanding, all questions fully answered. Patient instructed to return to ED, call 911, or call MD for any changes in condition.  Patient escorted via [place], and D/C home via private auto.  Dorcas Mcmurray, LPN 5/82/5189 8:42 PM

## 2021-04-30 NOTE — Care Management Obs Status (Signed)
[place] NOTIFICATION   Patient Details  Name: DAWAN FARNEY MRN: [id number] Date of Birth: Feb 08, 1934   Medicare Observation Status Notification Given:  Yes    Tommy Medal 04/30/2021, 4:09 PM

## 2021-04-30 NOTE — Progress Notes (Signed)
*  PRELIMINARY RESULTS* Echocardiogram 2D Echocardiogram has been performed.  Mitchell [name] 04/30/2021, 12:58 PM

## 2021-05-28 ENCOUNTER — Encounter (HOSPITAL_COMMUNITY): Payer: Self-pay | Admitting: *Deleted

## 2021-05-28 ENCOUNTER — Emergency Department (HOSPITAL_COMMUNITY): Payer: Medicare Other

## 2021-05-28 ENCOUNTER — Other Ambulatory Visit: Payer: Self-pay

## 2021-05-28 ENCOUNTER — Observation Stay (HOSPITAL_COMMUNITY)
Admission: EM | Admit: 2021-05-28 | Discharge: 2021-05-29 | Disposition: A | Discharge status: Home / Self Care | Payer: Medicare Other | Attending: Internal Medicine | Admitting: Internal Medicine

## 2021-05-28 DIAGNOSIS — Z8546 Personal history of malignant neoplasm of prostate: Secondary | ICD-10-CM | POA: Insufficient documentation

## 2021-05-28 DIAGNOSIS — I2510 Atherosclerotic heart disease of native coronary artery without angina pectoris: Secondary | ICD-10-CM | POA: Insufficient documentation

## 2021-05-28 DIAGNOSIS — N1832 Chronic kidney disease, stage 3b: Secondary | ICD-10-CM | POA: Insufficient documentation

## 2021-05-28 DIAGNOSIS — J9601 Acute respiratory failure with hypoxia: Secondary | ICD-10-CM | POA: Diagnosis present

## 2021-05-28 DIAGNOSIS — R0902 Hypoxemia: Secondary | ICD-10-CM

## 2021-05-28 DIAGNOSIS — R531 Weakness: Secondary | ICD-10-CM

## 2021-05-28 DIAGNOSIS — U071 COVID-19: Principal | ICD-10-CM | POA: Diagnosis present

## 2021-05-28 DIAGNOSIS — Z8616 Personal history of COVID-19: Secondary | ICD-10-CM | POA: Insufficient documentation

## 2021-05-28 DIAGNOSIS — R0602 Shortness of breath: Secondary | ICD-10-CM | POA: Diagnosis present

## 2021-05-28 DIAGNOSIS — Z20822 Contact with and (suspected) exposure to covid-19: Secondary | ICD-10-CM | POA: Insufficient documentation

## 2021-05-28 DIAGNOSIS — I440 Atrioventricular block, first degree: Secondary | ICD-10-CM | POA: Insufficient documentation

## 2021-05-28 DIAGNOSIS — I129 Hypertensive chronic kidney disease with stage 1 through stage 4 chronic kidney disease, or unspecified chronic kidney disease: Secondary | ICD-10-CM | POA: Diagnosis not present

## 2021-05-28 DIAGNOSIS — Z79899 Other long term (current) drug therapy: Secondary | ICD-10-CM | POA: Diagnosis not present

## 2021-05-28 DIAGNOSIS — J1282 Pneumonia due to coronavirus disease 2019: Secondary | ICD-10-CM | POA: Diagnosis not present

## 2021-05-28 DIAGNOSIS — R2681 Unsteadiness on feet: Secondary | ICD-10-CM | POA: Insufficient documentation

## 2021-05-28 DIAGNOSIS — R7303 Prediabetes: Secondary | ICD-10-CM | POA: Insufficient documentation

## 2021-05-28 LAB — TROPONIN I (HIGH SENSITIVITY)
Troponin I (High Sensitivity): 16 ng/L (ref <18)
Troponin I (High Sensitivity): 14 ng/L (ref <18)

## 2021-05-28 LAB — LACTATE DEHYDROGENASE: LDH: 165 U/L (ref 98–192)

## 2021-05-28 LAB — BRAIN NATRIURETIC PEPTIDE: B Natriuretic Peptide: 136 pg/mL — ABNORMAL HIGH (ref 0.0–100.0)

## 2021-05-28 LAB — CBC
HCT: 44.1 % (ref 39.0–52.0)
Hemoglobin: 14.3 g/dL (ref 13.0–17.0)
MCH: 30.3 pg (ref 26.0–34.0)
MCHC: 32.4 g/dL (ref 30.0–36.0)
MCV: 93.4 fL (ref 80.0–100.0)
Platelets: 220 10*3/uL (ref 150–400)
RBC: 4.72 MIL/uL (ref 4.22–5.81)
RDW: 15.1 % (ref 11.5–15.5)
WBC: 9.1 10*3/uL (ref 4.0–10.5)
nRBC: 0 % (ref 0.0–0.2)

## 2021-05-28 LAB — BASIC METABOLIC PANEL
Anion gap: 11 (ref 5–15)
BUN: 15 mg/dL (ref 8–23)
CO2: 26 mmol/L (ref 22–32)
Calcium: 9.4 mg/dL (ref 8.9–10.3)
Chloride: 99 mmol/L (ref 98–111)
Creatinine, Ser: 1.32 mg/dL — ABNORMAL HIGH (ref 0.61–1.24)
GFR, Estimated: 53 mL/min — ABNORMAL LOW (ref >60)
Glucose, Bld: 123 mg/dL — ABNORMAL HIGH (ref 70–99)
Potassium: 4.3 mmol/L (ref 3.5–5.1)
Sodium: 136 mmol/L (ref 135–145)

## 2021-05-28 LAB — C-REACTIVE PROTEIN: CRP: 14.1 mg/dL — ABNORMAL HIGH (ref <1.0)

## 2021-05-28 LAB — RESP PANEL BY RT-PCR (FLU A&B, COVID) ARPGX2
Influenza A by PCR: NEGATIVE
Influenza B by PCR: NEGATIVE
SARS Coronavirus 2 by RT PCR: POSITIVE — AB

## 2021-05-28 LAB — FIBRINOGEN: Fibrinogen: 568 mg/dL — ABNORMAL HIGH (ref 210–475)

## 2021-05-28 LAB — FERRITIN: Ferritin: 401 ng/mL — ABNORMAL HIGH (ref 24–336)

## 2021-05-28 LAB — HEPATIC FUNCTION PANEL
ALT: 14 U/L (ref 0–44)
AST: 21 U/L (ref 15–41)
Albumin: 3.1 g/dL — ABNORMAL LOW (ref 3.5–5.0)
Alkaline Phosphatase: 64 U/L (ref 38–126)
Bilirubin, Direct: 0.2 mg/dL (ref 0.0–0.2)
Indirect Bilirubin: 0.8 mg/dL (ref 0.3–0.9)
Total Bilirubin: 1 mg/dL (ref 0.3–1.2)
Total Protein: 6.1 g/dL — ABNORMAL LOW (ref 6.5–8.1)

## 2021-05-28 LAB — LACTIC ACID, PLASMA: Lactic Acid, Venous: 1.1 mmol/L (ref 0.5–1.9)

## 2021-05-28 LAB — TRIGLYCERIDES: Triglycerides: 98 mg/dL (ref <150)

## 2021-05-28 LAB — D-DIMER, QUANTITATIVE: D-Dimer, Quant: 1.6 ug/mL-FEU — ABNORMAL HIGH (ref 0.00–0.50)

## 2021-05-28 LAB — PROCALCITONIN: Procalcitonin: <0.1 ng/mL

## 2021-05-28 MED ORDER — ONDANSETRON HCL 4 MG/2ML IJ SOLN
4.0000 mg | Freq: Four times a day (QID) | INTRAMUSCULAR | Status: DC | PRN
  Administered 2021-05-29: 4 mg via INTRAVENOUS
  Filled 2021-05-28: qty 2

## 2021-05-28 MED ORDER — ENOXAPARIN SODIUM 40 MG/0.4ML IJ SOSY
40.0000 mg | PREFILLED_SYRINGE | INTRAMUSCULAR | Status: DC
  Administered 2021-05-28: 40 mg via SUBCUTANEOUS
  Filled 2021-05-28: qty 0.4

## 2021-05-28 MED ORDER — ENSURE ENLIVE PO LIQD: 237.0000 mL | Freq: Two times a day (BID) | ORAL | Status: DC

## 2021-05-28 MED ORDER — SODIUM CHLORIDE 0.9 % IV BOLUS
500.0000 mL | Freq: Once | INTRAVENOUS | Status: AC
  Administered 2021-05-28: 500 mL via INTRAVENOUS

## 2021-05-28 MED ORDER — ALBUTEROL SULFATE HFA 108 (90 BASE) MCG/ACT IN AERS
2.0000 | INHALATION_SPRAY | Freq: Once | RESPIRATORY_TRACT | Status: AC
  Administered 2021-05-28: 2 via RESPIRATORY_TRACT
  Filled 2021-05-28: qty 6.7

## 2021-05-28 MED ORDER — GUAIFENESIN-DM 100-10 MG/5ML PO SYRP
10.0000 mL | ORAL_SOLUTION | Freq: Four times a day (QID) | ORAL | Status: DC
  Administered 2021-05-28 – 2021-05-29 (×2): 10 mL via ORAL
  Filled 2021-05-28 (×3): qty 10

## 2021-05-28 MED ORDER — ASPIRIN EC 81 MG PO TBEC
81.0000 mg | DELAYED_RELEASE_TABLET | Freq: Every day | ORAL | Status: DC
  Administered 2021-05-29: 81 mg via ORAL
  Filled 2021-05-28: qty 1

## 2021-05-28 MED ORDER — POLYETHYLENE GLYCOL 3350 17 G PO PACK: 17.0000 g | PACK | Freq: Every day | ORAL | Status: DC | PRN

## 2021-05-28 MED ORDER — ACETAMINOPHEN 325 MG PO TABS
650.0000 mg | ORAL_TABLET | Freq: Four times a day (QID) | ORAL | Status: DC | PRN
  Administered 2021-05-29: 650 mg via ORAL
  Filled 2021-05-28: qty 2

## 2021-05-28 MED ORDER — ALBUTEROL SULFATE HFA 108 (90 BASE) MCG/ACT IN AERS
2.0000 | INHALATION_SPRAY | Freq: Four times a day (QID) | RESPIRATORY_TRACT | Status: DC
  Administered 2021-05-29: 2 via RESPIRATORY_TRACT
  Filled 2021-05-28: qty 6.7

## 2021-05-28 MED ORDER — ASPIRIN 81 MG PO CHEW
324.0000 mg | CHEWABLE_TABLET | Freq: Once | ORAL | Status: AC
  Administered 2021-05-28: 324 mg via ORAL
  Filled 2021-05-28: qty 4

## 2021-05-28 MED ORDER — PREDNISONE 10 MG PO TABS
5.0000 mg | ORAL_TABLET | Freq: Every day | ORAL | Status: DC
  Administered 2021-05-29: 5 mg via ORAL
  Filled 2021-05-28: qty 1

## 2021-05-28 MED ORDER — IOHEXOL 350 MG/ML SOLN
100.0000 mL | Freq: Once | INTRAVENOUS | Status: AC | PRN
  Administered 2021-05-28: 80 mL via INTRAVENOUS

## 2021-05-28 MED ORDER — ONDANSETRON HCL 4 MG PO TABS: 4.0000 mg | ORAL_TABLET | Freq: Four times a day (QID) | ORAL | Status: DC | PRN

## 2021-05-28 MED ORDER — ALLOPURINOL 100 MG PO TABS
200.0000 mg | ORAL_TABLET | Freq: Every day | ORAL | Status: DC
  Administered 2021-05-29: 200 mg via ORAL
  Filled 2021-05-28: qty 2

## 2021-05-28 MED ORDER — NIRMATRELVIR/RITONAVIR (PAXLOVID) TABLET (RENAL DOSING)
2.0000 | ORAL_TABLET | Freq: Two times a day (BID) | ORAL | Status: DC
  Administered 2021-05-28 – 2021-05-29 (×2): 2 via ORAL
  Filled 2021-05-28: qty 20

## 2021-05-28 NOTE — ED Triage Notes (Signed)
Multiple complaints, chest pain, shortness of breath, nasal congestion

## 2021-05-28 NOTE — ED Notes (Signed)
Pt assist to bedside commode, pt able to get back to bed without assist.

## 2021-05-28 NOTE — ED Provider Notes (Signed)
[facility] EMERGENCY DEPARTMENT Provider Note   CSN: [id number] Arrival date & time: 05/28/21  1059     History Chief Complaint  Patient presents with   Chest Pain   Shortness of Breath    Mitchell [name] is a 87 y.o. male with a history including CAD, hypertension, hyperlipidemia and is actively treated for prostate cancer presenting with a 2-day history of generalized worsening fatigue along with chest pain, shortness of breath, nasal congestion and chills without any documented fevers.  He reports sharp left-sided chest pain with deep inspiration and constant pressure sensation in this region with normal respirations.  He has had chills and increasing weakness to the point of just walking across the room makes him very tired and short of breath.  Wife endorses that he has had intermittent wheezing.  He has had a mild nonproductive cough.  He had a sore throat which has resolved.  He has no abdominal pain, nausea or vomiting, dysuria, diarrhea.  He denies hemoptysis, no increased peripheral edema Deema but does have chronic lower leg swelling which is equal and bilateral.  He does wife recently took a bus tour of [place], just returned home the end of last week.  He reports generalized fatigue during the trip but did not feel sick until returning home.  He is COVID vaccinated.  The history is provided by the patient and the spouse.      Past Medical History:  Diagnosis Date   Allergic rhinitis    CAD (coronary artery disease)    Moderate, nonobstructive disease   Dupuytren's contracture    Right hand   Essential hypertension    GERD (gastroesophageal reflux disease)    Gout    Herpes zoster    twice   Hyperlipidemia    Idiopathic scoliosis in adult patient    Inguinal hernia    Right sided   Internal hemorrhoids    Lumbosacral spondylosis    Neuropathy    Pre-diabetes    Prostate cancer ([place])    with bone mets- has been treated with chemo    Patient Active Problem  List   Diagnosis Date Noted   COVID-19 virus infection 05/28/2021   Chest pain 04/29/2021   Fracture of twelfth thoracic vertebra ([place]) 04/29/2021   Lumbar spondylosis 04/29/2021   Spondylosis, lumbosacral 04/29/2021   Thoracic vertebral fracture ([place]) 03/14/2021   S/P kyphoplasty 03/14/2021   Elevated blood-pressure reading, without diagnosis of hypertension 02/27/2021   Wedge compression fracture of t11-T12 vertebra, subsequent encounter for fracture with delayed healing 02/27/2021   Compression fracture of T12 vertebra ([place]) 02/18/2021   Reactive airway disease with wheezing without complication 76/54/6503   Weakness of both lower extremities 01/21/2021   Obesity (BMI 30.0-34.9) 01/07/2021   Chronic low back pain 11/22/2020   Degeneration of lumbar intervertebral disc 11/22/2020   Degenerative lumbar spinal stenosis 11/22/2020   Lumbago with sciatica, right side 11/22/2020   Lumbar radiculopathy 11/22/2020   Spondylolisthesis of lumbar region 11/22/2020   Other chronic pain 11/20/2020   Squamous cell carcinoma of scalp 05/08/2020   Coronary artery disease involving native coronary artery of native heart without angina pectoris 12/09/2019   Atypical angina ([place]) 12/08/2019   DOE (dyspnea on exertion) 12/08/2019   Accelerating angina ([facility])    PAC (premature atrial contraction) 09/28/2019   PVC's (premature ventricular contractions) 09/28/2019   Sinus tachycardia 10/01/2018   Prediabetes 08/29/2018   Bilateral hip joint arthritis 09/26/2016   Stage 3b chronic kidney disease ([place])  05/16/2016   Constipation 05/07/2015   Olecranon bursitis, left elbow 04/12/2015   Left wrist fracture 01/19/2014   Localized edema 11/09/2013   Neutropenia ([place]) 05/31/2013   Arthritis 04/13/2013   Bone metastases ([place]) 04/13/2013   Prostate cancer ([place]) 03/17/2013   Scoliosis 02/02/2013   Arthritis, lumbar spine 02/02/2013   Impacted cerumen 01/01/2012   Sensorineural hearing loss, bilateral  01/01/2012   Seronegative rheumatoid arthritis ([place]) 05/04/2011   Encounter for long-term (current) use of medications 12/16/2010   Benign essential hypertension 08/31/2009   S/P bilateral inguinal hernia repair 08/29/2009   S/P tympanic tube insertion 08/29/2009   S/P vasectomy 08/29/2009   Internal hemorrhoids 11/16/2006   Contracture of palmar fascia 10/17/2005   Herpes zoster 09/01/2005   Inguinal hernia 06/01/2005   Polyp of colon 11/17/2001   Hyperlipidemia 10/27/2001   Gastroesophageal reflux disease 09/04/2000   Idiopathic chronic gout of right foot without tophus 01/07/2000   Allergic rhinitis due to other allergen 12/16/1999    Past Surgical History:  Procedure Laterality Date   EYE SURGERY Bilateral    cataract   HERNIA REPAIR Bilateral    INTRAVASCULAR PRESSURE WIRE/FFR STUDY N/A 12/08/2019   Procedure: INTRAVASCULAR PRESSURE WIRE/FFR STUDY;  Surgeon: Leonie Man, MD;  Location: [place] [facility];  Service: Cardiovascular;  Laterality: N/A;   KYPHOPLASTY N/A 03/14/2021   Procedure: Thoracic Twelve  KYPHOPLASTY;  Surgeon: Erline Levine, MD;  Location: [place];  Service: Neurosurgery;  Laterality: N/A;  3C/RM 21   LEFT HEART CATH AND CORONARY ANGIOGRAPHY N/A 12/08/2019   Procedure: LEFT HEART CATH AND CORONARY ANGIOGRAPHY;  Surgeon: Leonie Man, MD;  Location: [facility];  Service: Cardiovascular;  Laterality: N/A;   TYMPANIC MEMBRANE REPAIR     VASECTOMY         Family History  Problem Relation Age of Onset   Heart failure Mother    Heart failure Father    CAD Father    Cancer Sister    CAD Brother    Stroke Brother    Neuropathy Neg Hx     Social History   Tobacco Use   Smoking status: Never   Smokeless tobacco: Never  Vaping Use   Vaping Use: Never used  Substance Use Topics   Alcohol use: Not Currently    Alcohol/week: 0.0 - 1.0 standard drinks    Comment: "at best"   Drug use: Never    Home Medications Prior to Admission  medications   Medication Sig Start Date End Date Taking? Authorizing Provider  acetaminophen (TYLENOL) 500 MG tablet Take 1,000 mg by mouth every 6 (six) hours as needed.    [provider]  albuterol (VENTOLIN HFA) 108 (90 Base) MCG/ACT inhaler Inhale 1-2 puffs into the lungs every 6 (six) hours as needed for wheezing or shortness of breath.    [provider]  allopurinol (ZYLOPRIM) 100 MG tablet Take 200 mg by mouth daily.     [provider]  amLODipine (NORVASC) 2.5 MG tablet Take 2.5 mg by mouth daily.    [provider]  aspirin EC 81 MG tablet Take 81 mg by mouth daily.    [provider]  atorvastatin (LIPITOR) 10 MG tablet TAKE 1 TABLET BY MOUTH EVERY DAY Patient taking differently: Take 10 mg by mouth daily. 12/17/20   Verta Ellen., NP  bisoprolol (ZEBETA) 5 MG tablet TAKE 1 TABLET BY MOUTH EVERY DAY 03/25/21   Satira Sark, MD  colchicine 0.6  MG tablet Take 0.6 mg by mouth 2 (two) times daily as needed (gout).    [provider]  fluticasone (FLONASE) 50 MCG/ACT nasal spray Place 2 sprays into both nostrils daily as needed for allergies or rhinitis.    [provider]  gabapentin (NEURONTIN) 300 MG capsule Take 600 mg by mouth 3 (three) times daily.     [provider]  hydrochlorothiazide (HYDRODIURIL) 25 MG tablet TAKE 1 TABLET BY MOUTH EVERY DAY Patient taking differently: Take 25 mg by mouth 2 (two) times daily. 01/22/21   Satira Sark, MD  hydrocortisone 2.5 % cream Apply 1 application topically 2 (two) times daily as needed (irritation).    [provider]  isosorbide mononitrate (IMDUR) 30 MG 24 hr tablet Take 0.5 tablets (15 mg total) by mouth daily. 04/30/21   Barton Dubois, MD  ketoconazole (NIZORAL) 2 % cream Apply topically 2 (two) times daily. 03/18/21   [provider]  leuprolide, 6 Month, (ELIGARD) 45 MG injection Inject 45 mg into the skin every 6 (six) months.     [provider]  meclizine (ANTIVERT) 25 MG tablet Take 25 mg by mouth 3 (three) times daily as needed for dizziness. Patient not taking: Reported on 04/29/2021    [provider]  Multiple Vitamins-Minerals (MULTIVITAMIN WITH MINERALS) tablet Take 1 tablet by mouth daily.    [provider]  predniSONE (DELTASONE) 5 MG tablet Take 5 mg by mouth daily with breakfast.    [provider]  Propylene Glycol (SYSTANE BALANCE) 0.6 % SOLN Place 1 drop into both eyes daily as needed (dry eyes).    [provider]  traMADol (ULTRAM) 50 MG tablet Take 25 mg by mouth every 8 (eight) hours as needed for moderate pain.    [provider]    Allergies    Lisinopril, Losartan, and Penicillins  Review of Systems   Review of Systems  Constitutional:  Positive for chills and fatigue. Negative for fever.  HENT:  Positive for congestion and sore throat.   Eyes: Negative.   Respiratory:  Positive for cough and shortness of breath. Negative for chest tightness.   Cardiovascular:  Negative for chest pain.  Gastrointestinal:  Negative for abdominal pain, diarrhea, nausea and vomiting.  Genitourinary: Negative.  Negative for dysuria.  Musculoskeletal:  Positive for myalgias. Negative for arthralgias, joint swelling and neck pain.  Skin: Negative.  Negative for rash and wound.  Neurological:  Positive for weakness. Negative for dizziness, light-headedness, numbness and headaches.  Psychiatric/Behavioral: Negative.     Physical Exam Updated Vital Signs BP 113/61   Pulse 91   Temp 99.2 F (37.3 C) (Oral)   Resp 18   Ht 5\' 9"  (1.753 m)   Wt 95.3 kg   SpO2 90%   BMI 31.01 kg/m   Physical Exam Vitals and nursing note reviewed.  Constitutional:      General: He is not in acute distress.    Appearance: He is well-developed. He is ill-appearing.     Comments: Patient appears tired.  HENT:     Head: Normocephalic and atraumatic.  Eyes:      Conjunctiva/sclera: Conjunctivae normal.     Pupils: Pupils are equal, round, and reactive to light.  Cardiovascular:     Rate and Rhythm: Normal rate and regular rhythm.     Heart sounds: Normal heart sounds. No murmur heard. Pulmonary:     Effort: Pulmonary effort is normal. No respiratory distress.     Breath  sounds: No stridor. Decreased breath sounds present. No wheezing, rhonchi or rales.     Comments: Mildly reduced breath sounds throughout, no prolonged expirations, no wheezing.  No rales.  Patient noted to desaturate down to 88% while speaking but at rest, he is speaking in full sentences. Abdominal:     General: Bowel sounds are normal.     Palpations: Abdomen is soft.     Tenderness: There is no abdominal tenderness.  Musculoskeletal:        General: Normal range of motion.     Cervical back: Normal range of motion.     Right lower leg: Edema present.     Left lower leg: Edema present.     Comments: Bilateral ankle edema which is equal bilaterally.  No calf tenderness.  Skin:    General: Skin is warm and dry.  Neurological:     General: No focal deficit present.     Mental Status: He is alert.    ED Results / Procedures / Treatments   Labs (all labs ordered are listed, but only abnormal results are displayed) Labs Reviewed  RESP PANEL BY RT-PCR (FLU A&B, COVID) ARPGX2 - Abnormal; Notable for the following components:      Result Value   SARS Coronavirus 2 by RT PCR POSITIVE (*)    All other components within normal limits  BASIC METABOLIC PANEL - Abnormal; Notable for the following components:   Glucose, Bld 123 (*)    Creatinine, Ser 1.32 (*)    GFR, Estimated 53 (*)    All other components within normal limits  D-DIMER, QUANTITATIVE - Abnormal; Notable for the following components:   D-Dimer, Quant 1.60 (*)    All other components within normal limits  BRAIN NATRIURETIC PEPTIDE - Abnormal; Notable for the following components:   B Natriuretic Peptide 136.0 (*)     All other components within normal limits  CULTURE, BLOOD (ROUTINE X 2)  CULTURE, BLOOD (ROUTINE X 2)  CBC  LACTIC ACID, PLASMA  LACTIC ACID, PLASMA  PROCALCITONIN  LACTATE DEHYDROGENASE  FERRITIN  TRIGLYCERIDES  FIBRINOGEN  C-REACTIVE PROTEIN  HEPATIC FUNCTION PANEL  TROPONIN I (HIGH SENSITIVITY)  TROPONIN I (HIGH SENSITIVITY)    EKG EKG Interpretation  Date/Time:  Tuesday May 28 2021 11:34:48 EDT Ventricular Rate:  100 PR Interval:  238 QRS Duration: 90 QT Interval:  364 QTC Calculation: 469 R Axis:   -26 Text Interpretation: Sinus rhythm with 1st degree A-V block with frequent Premature ventricular complexes Minimal voltage criteria for LVH, may be normal variant ( R in aVL ) Possible Anterolateral infarct , age undetermined Abnormal ECG Confirmed by Noemi Chapel [phone]) on 05/28/2021 5:22:58 PM  Radiology DG Chest 2 View  Result Date: 05/28/2021 CLINICAL DATA:  Chest pain EXAM: CHEST - 2 VIEW COMPARISON:  Chest x-ray dated April 29, 2021 FINDINGS: Cardiac and mediastinal contours are unchanged. Mild scattered linear opacities which are similar to prior exam. No focal consolidation. No evidence of pleural effusion or pneumothorax. IMPRESSION: Mild scattered linear opacities which are similar to prior exam, likely due to scarring or atelectasis. No focal consolidation. Electronically Signed   By: Yetta Glassman M.D.   On: 05/28/2021 12:19   CT Angio Chest PE W and/or Wo Contrast  Result Date: 05/28/2021 CLINICAL DATA:  Pulmonary emboli suspected. High probability. Chest pain and shortness of breath beginning yesterday. EXAM: CT ANGIOGRAPHY CHEST WITH CONTRAST TECHNIQUE: Multidetector CT imaging of the chest was performed using the standard protocol during bolus administration  of intravenous contrast. Multiplanar CT image reconstructions and MIPs were obtained to evaluate the vascular anatomy. CONTRAST:  7mL OMNIPAQUE IOHEXOL 350 MG/ML SOLN COMPARISON:  Chest  radiography same day.  CT angiography 04/29/2021. FINDINGS: Cardiovascular: Heart size is normal. Coronary artery calcification is present. Aortic atherosclerotic calcification is present. Pulmonary arterial opacification is good. There are no pulmonary emboli. Mediastinum/Nodes: No mediastinal or hilar mass or active lymphadenopathy. Chronic calcified nodes in the right hilum. Lungs/Pleura: No evidence of active infiltrate. Chronic calcified granuloma in the right lower lobe. No pleural effusion. Upper Abdomen: Negative Musculoskeletal: Compression deformities at the superior endplates of T3 and T4, unchanged since 1 month ago. Previously augmented fracture at T12, not fully imaged. Review of the MIP images confirms the above findings. IMPRESSION: No pulmonary emboli or other acute chest pathology. Aortic atherosclerosis.  Coronary artery calcification. Calcified granuloma right lower lobe with calcified right hilar nodes. Previously seen thoracic compression fractures without change or new fracture. Previously seen rib fractures without visible new fracture. Electronically Signed   By: Nelson Chimes M.D.   On: 05/28/2021 16:11    Procedures Procedures   Medications Ordered in ED Medications  aspirin chewable tablet 324 mg (324 mg Oral Given 05/28/21 1516)  albuterol (VENTOLIN HFA) 108 (90 Base) MCG/ACT inhaler 2 puff (2 puffs Inhalation Given 05/28/21 1411)  sodium chloride 0.9 % bolus 500 mL (0 mLs Intravenous Stopped 05/28/21 1700)  iohexol (OMNIPAQUE) 350 MG/ML injection 100 mL (80 mLs Intravenous Contrast Given 05/28/21 1540)    ED Course  I have reviewed the triage vital signs and the nursing notes.  Pertinent labs & imaging results that were available during my care of the patient were reviewed by me and considered in my medical decision making (see chart for details).    MDM Rules/Calculators/A&P                           Patient with shortness of breath with pleuritic component,  hypoxic to 88%.  Labs obtained with significant findings including a D-dimer of 1.60, also a BNP is elevated 136.  Given his recent long road travel concern initially was for pulmonary embolism.  CT angio was negative.  After this was resulted his COVID test came back and is positive.  He continues to be short of breath off of the oxygen despite given albuterol MDI inhaler, still no wheezing on my exam.  He did not tolerate standing at the bedside, he became very weak and O2 sats went to 88% on room air with simply standing.  Additional preadmission labs were ordered, patient will need admission for supportive care for this infection.  Discussed with Dr. Denton Brick who accepts patient for admission Final Clinical Impression(s) / ED Diagnoses Final diagnoses:  COVID-19  Hypoxia  Weakness    Rx / DC Orders ED Discharge Orders     None        Landis Martins 05/28/21 Maryagnes Amos, MD 05/28/21 1725

## 2021-05-28 NOTE — H&P (Addendum)
History and Physical    Mitchell [name] GYF:[id number] DOB: 1934-06-10 DOA: 05/28/2021  PCP: Eber Hong, MD   Patient coming from: Home  I have personally briefly reviewed patient's old medical records in [place]  Chief Complaint: Difficulty breathing, congestion, weakness  HPI: Mitchell [name] is a 87 y.o. male with medical history significant for CAD, CKD 3, Rh arthritis, HTN, prostate cancer. Patient presented to the ED with complaints of chest pain, congestion and difficulty breathing that started yesterday.  He also reports a cough productive of clear phlegm.  Chest pain is mid lower chest, he describes it as an upset stomach.  Intermittent and not related to activity or food.  Reports chest pain is different from chest pain that brought him to the hospital last month.  He has chronic intermittent bilateral lower extremity swelling that is not changed from prior.  Spouse reports intermittent wheezing.  No vomiting no loose stools, some very mild abdominal pain.  Reports generalized weakness which is not new and has been present for at least 3 weeks.  He has poor effort tolerance and so unable to stand for any amount of time.  Patient has just returned from from a bus tour of [place]. Patient has been vaccinated x3.  Recent hospitalization 9/19-9/20 for chest pain with history of nonobstructive CAD.  Evaluated by cardiology.  Patient has had extensive work-up last year with nuclear stress WITHOUT evidence of obstructive disease.  With reassuring limited echo, no further cardiac work-up was recommended.  Patient was discharged home with recommendation to consider adding low-dose Imdur as trial.  ED Course: Evaluation O2 sats dropping to 87% on room air.  T-max 99.9.  Heart rate 70s to 89.  Respiratory rate 14-23.  Troponin 16 > 14.  BNP 136.  D-dimer 1.6, CTA chest without acute abnormality, previously seen thoracic compression and rib fractures unchanged. Hospitalist to admit for acute  respiratory failure.  Review of Systems: As per HPI all other systems reviewed and negative.  Past Medical History:  Diagnosis Date   Allergic rhinitis    CAD (coronary artery disease)    Moderate, nonobstructive disease   Dupuytren's contracture    Right hand   Essential hypertension    GERD (gastroesophageal reflux disease)    Gout    Herpes zoster    twice   Hyperlipidemia    Idiopathic scoliosis in adult patient    Inguinal hernia    Right sided   Internal hemorrhoids    Lumbosacral spondylosis    Neuropathy    Pre-diabetes    Prostate cancer ([place])    with bone mets- has been treated with chemo    Past Surgical History:  Procedure Laterality Date   EYE SURGERY Bilateral    cataract   HERNIA REPAIR Bilateral    INTRAVASCULAR PRESSURE WIRE/FFR STUDY N/A 12/08/2019   Procedure: INTRAVASCULAR PRESSURE WIRE/FFR STUDY;  Surgeon: Leonie Man, MD;  Location: [facility];  Service: Cardiovascular;  Laterality: N/A;   KYPHOPLASTY N/A 03/14/2021   Procedure: Thoracic Twelve  KYPHOPLASTY;  Surgeon: Erline Levine, MD;  Location: [facility];  Service: Neurosurgery;  Laterality: N/A;  3C/RM 21   LEFT HEART CATH AND CORONARY ANGIOGRAPHY N/A 12/08/2019   Procedure: LEFT HEART CATH AND CORONARY ANGIOGRAPHY;  Surgeon: Leonie Man, MD;  Location: [facility];  Service: Cardiovascular;  Laterality: N/A;   TYMPANIC MEMBRANE REPAIR     VASECTOMY       reports that  he has never smoked. He has never used smokeless tobacco. He reports that he does not currently use alcohol. He reports that he does not use drugs.  Allergies  Allergen Reactions   Lisinopril Cough   Losartan Cough   Penicillins Rash    Family History  Problem Relation Age of Onset   Heart failure Mother    Heart failure Father    CAD Father    Cancer Sister    CAD Brother    Stroke Brother    Neuropathy Neg Hx     Prior to Admission medications   Medication Sig Start Date End Date Taking?  Authorizing Provider  acetaminophen (TYLENOL) 500 MG tablet Take 1,000 mg by mouth every 6 (six) hours as needed.    [provider]  albuterol (VENTOLIN HFA) 108 (90 Base) MCG/ACT inhaler Inhale 1-2 puffs into the lungs every 6 (six) hours as needed for wheezing or shortness of breath.    [provider]  allopurinol (ZYLOPRIM) 100 MG tablet Take 200 mg by mouth daily.     [provider]  amLODipine (NORVASC) 2.5 MG tablet Take 2.5 mg by mouth daily.    [provider]  aspirin EC 81 MG tablet Take 81 mg by mouth daily.    [provider]  atorvastatin (LIPITOR) 10 MG tablet TAKE 1 TABLET BY MOUTH EVERY DAY Patient taking differently: Take 10 mg by mouth daily. 12/17/20   Verta Ellen., NP  bisoprolol (ZEBETA) 5 MG tablet TAKE 1 TABLET BY MOUTH EVERY DAY 03/25/21   Satira Sark, MD  colchicine 0.6 MG tablet Take 0.6 mg by mouth 2 (two) times daily as needed (gout).    [provider]  fluticasone (FLONASE) 50 MCG/ACT nasal spray Place 2 sprays into both nostrils daily as needed for allergies or rhinitis.    [provider]  gabapentin (NEURONTIN) 300 MG capsule Take 600 mg by mouth 3 (three) times daily.     [provider]  hydrochlorothiazide (HYDRODIURIL) 25 MG tablet TAKE 1 TABLET BY MOUTH EVERY DAY Patient taking differently: Take 25 mg by mouth 2 (two) times daily. 01/22/21   Satira Sark, MD  hydrocortisone 2.5 % cream Apply 1 application topically 2 (two) times daily as needed (irritation).    [provider]  isosorbide mononitrate (IMDUR) 30 MG 24 hr tablet Take 0.5 tablets (15 mg total) by mouth daily. 04/30/21   Barton Dubois, MD  ketoconazole (NIZORAL) 2 % cream Apply topically 2 (two) times daily. 03/18/21   [provider]  leuprolide, 6 Month, (ELIGARD) 45 MG injection Inject 45 mg into the skin every 6 (six) months.    [provider]  meclizine (ANTIVERT) 25 MG tablet  Take 25 mg by mouth 3 (three) times daily as needed for dizziness. Patient not taking: Reported on 04/29/2021    [provider]  Multiple Vitamins-Minerals (MULTIVITAMIN WITH MINERALS) tablet Take 1 tablet by mouth daily.    [provider]  predniSONE (DELTASONE) 5 MG tablet Take 5 mg by mouth daily with breakfast.    [provider]  Propylene Glycol (SYSTANE BALANCE) 0.6 % SOLN Place 1 drop into both eyes daily as needed (dry eyes).    [provider]  traMADol (ULTRAM) 50 MG tablet Take 25 mg by mouth every 8 (eight) hours as needed for moderate pain.    [provider]    Physical Exam: Vitals:   05/28/21 1400 05/28/21 1430 05/28/21 1530  05/28/21 1600  BP: (!) 112/57 (!) 108/56 97/62 113/61  Pulse: 80 86 94 84  Resp: 18 20 20 20   Temp:      TempSrc:      SpO2: 91% 91%  95%  Weight:      Height:        Constitutional: NAD, calm, comfortable Vitals:   05/28/21 1400 05/28/21 1430 05/28/21 1530 05/28/21 1600  BP: (!) 112/57 (!) 108/56 97/62 113/61  Pulse: 80 86 94 84  Resp: 18 20 20 20   Temp:      TempSrc:      SpO2: 91% 91%  95%  Weight:      Height:       Eyes: PERRL, lids and conjunctivae normal ENMT: Mucous membranes are moist.  Neck: normal, supple, no masses, no thyromegaly Respiratory: clear to auscultation bilaterally, no wheezing, no crackles. Normal respiratory effort. No accessory muscle use.  Cardiovascular: Regular rate and rhythm, no murmurs / rubs / gallops. Trace bilateral extremity edema. 2+ pedal pulses.  Abdomen: no tenderness, no masses palpated. No hepatosplenomegaly. Bowel sounds positive.  Musculoskeletal: no clubbing / cyanosis. No joint deformity upper and lower extremities. Good ROM, no contractures. Normal muscle tone.  Skin: no rashes, lesions, ulcers. No induration Neurologic: No apparent cranial nerve abnormality, moving extremities spontaneously. Psychiatric: Normal judgment and insight. Alert and  oriented x 3. Normal mood.   Labs on Admission: I have personally reviewed following labs and imaging studies  CBC: Recent Labs  Lab 05/28/21 1157  WBC 9.1  HGB 14.3  HCT 44.1  MCV 93.4  PLT 825   Basic Metabolic Panel: Recent Labs  Lab 05/28/21 1157  NA 136  K 4.3  CL 99  CO2 26  GLUCOSE 123*  BUN 15  CREATININE 1.32*  CALCIUM 9.4   Radiological Exams on Admission: DG Chest 2 View  Result Date: 05/28/2021 CLINICAL DATA:  Chest pain EXAM: CHEST - 2 VIEW COMPARISON:  Chest x-ray dated April 29, 2021 FINDINGS: Cardiac and mediastinal contours are unchanged. Mild scattered linear opacities which are similar to prior exam. No focal consolidation. No evidence of pleural effusion or pneumothorax. IMPRESSION: Mild scattered linear opacities which are similar to prior exam, likely due to scarring or atelectasis. No focal consolidation. Electronically Signed   By: Yetta Glassman M.D.   On: 05/28/2021 12:19   CT Angio Chest PE W and/or Wo Contrast  Result Date: 05/28/2021 CLINICAL DATA:  Pulmonary emboli suspected. High probability. Chest pain and shortness of breath beginning yesterday. EXAM: CT ANGIOGRAPHY CHEST WITH CONTRAST TECHNIQUE: Multidetector CT imaging of the chest was performed using the standard protocol during bolus administration of intravenous contrast. Multiplanar CT image reconstructions and MIPs were obtained to evaluate the vascular anatomy. CONTRAST:  59mL OMNIPAQUE IOHEXOL 350 MG/ML SOLN COMPARISON:  Chest radiography same day.  CT angiography 04/29/2021. FINDINGS: Cardiovascular: Heart size is normal. Coronary artery calcification is present. Aortic atherosclerotic calcification is present. Pulmonary arterial opacification is good. There are no pulmonary emboli. Mediastinum/Nodes: No mediastinal or hilar mass or active lymphadenopathy. Chronic calcified nodes in the right hilum. Lungs/Pleura: No evidence of active infiltrate. Chronic calcified granuloma in the  right lower lobe. No pleural effusion. Upper Abdomen: Negative Musculoskeletal: Compression deformities at the superior endplates of T3 and T4, unchanged since 1 month ago. Previously augmented fracture at T12, not fully imaged. Review of the MIP images confirms the above findings. IMPRESSION: No pulmonary emboli or other acute chest pathology. Aortic atherosclerosis.  Coronary artery calcification.  Calcified granuloma right lower lobe with calcified right hilar nodes. Previously seen thoracic compression fractures without change or new fracture. Previously seen rib fractures without visible new fracture. Electronically Signed   By: Nelson Chimes M.D.   On: 05/28/2021 16:11    EKG: Independently reviewed.  Sinus rhythm rate 100.  QTc 469.  PVCs present.  Assessment/Plan Principal Problem:   COVID-19 virus infection Active Problems:   Acute respiratory failure with hypoxia (HCC)   COVID-19 infection with acute hypoxic respiratory failure- with dyspnea, O2 sats dropping to 87% on room air on my evaluation, worsening effort intolerance.  Will presume viral bronchitis as etiology. Reported wheezing by spouse prior to arrival.  Lung exam unremarkable.  CTA chest negative for PE or other intrathoracic abnormality.  Vaccinated against COVID x3.  Chronic stable bilateral symmetric swelling.  BNP Slightly elevated at 136 when compared to prior -Paxlovid ( he has not taken any of his medications including statins in at least 48 hours). -Mucolytic's -DuoNebs - 53mls bolus given - PT eval  Chest pain- with history of nonobstructive CAD.  Chest pain today atypical.  Appears to be more of a GI issue.  Troponins 16 > 14.  EKG without significant change from prior.  Limited echo  04/2021 -stable, shows preserved EF of 55 - 60%, without regional wall motion abnormalities. -Repeat EKG in the morning -Resume aspirin -Hold statins for now while on Paxlovid - Hold imdur, blood pressure soft  Hypertension- currently  soft , systolic low 295A - Hold bisoprolol, norvasc 2.5mg  daily, Hold HCTZ and Lasix also for now with contrast exposure   History of prostate cancer-follows with onc[facility].  On leuprolide injections.   Gout- - resume allorpurinol   Seronegative rheumatoid arthritis - Continue chronic prednisone 5 mg daily - Doubt need for stress dose steroids at this time  Chronic back pain- chest CT again confirms compression deformities of T3-T4 that are unchanged.   DVT prophylaxis: Lovenox Code Status: Full code Family Communication: Spouse at bedside Disposition Plan: ~ 1 -2- days Consults called: None Admission status: Obs, tele    Bethena Roys MD [facility]  05/28/2021, 5:55 PM

## 2021-05-28 NOTE — Progress Notes (Signed)
Pharmacy Antibiotic Note  Mitchell [name] is a 87 y.o. male admitted on 05/28/2021 with  COVID-19 pneumonia .  Pharmacy has been consulted for Paxlovid dosing. Current Scr 1.32, with estimated CrCl ~47 mL/min. Due to decreased renal function, will initiate Paxlovid at lower dose.   Plan: Give Nirmatrelvir 150 mg and Ritonavir 100 mg (2 tablets total per dose) twice daily for 5 days total.  Height: 5\' 9"  (175.3 cm) Weight: 95.3 kg (210 lb) IBW/kg (Calculated) : 70.7  Temp (24hrs), Avg:98.9 F (37.2 C), Min:97.6 F (36.4 C), Max:99.9 F (37.7 C)  Recent Labs  Lab 05/28/21 1157 05/28/21 1710  WBC 9.1  --   CREATININE 1.32*  --   LATICACIDVEN  --  1.1    Estimated Creatinine Clearance: 46.6 mL/min (A) (by C-G formula based on SCr of 1.32 mg/dL (H)).    Allergies  Allergen Reactions   Lisinopril Cough   Losartan Cough   Penicillins Rash     Thank you for allowing pharmacy to be a part of this patient's care.  Ardyth Harps, PharmD Clinical Pharmacist

## 2021-05-28 NOTE — ED Notes (Signed)
BP cuff off while lab drawing blood.

## 2021-05-28 NOTE — ED Notes (Signed)
Pt provided ginger ale and pack of peanut butter crackers.

## 2021-05-29 DIAGNOSIS — U071 COVID-19: Secondary | ICD-10-CM | POA: Diagnosis not present

## 2021-05-29 LAB — C-REACTIVE PROTEIN: CRP: 12.7 mg/dL — ABNORMAL HIGH (ref <1.0)

## 2021-05-29 LAB — COMPREHENSIVE METABOLIC PANEL
ALT: 14 U/L (ref 0–44)
AST: 22 U/L (ref 15–41)
Albumin: 3 g/dL — ABNORMAL LOW (ref 3.5–5.0)
Alkaline Phosphatase: 60 U/L (ref 38–126)
Anion gap: 8 (ref 5–15)
BUN: 14 mg/dL (ref 8–23)
CO2: 25 mmol/L (ref 22–32)
Calcium: 8.6 mg/dL — ABNORMAL LOW (ref 8.9–10.3)
Chloride: 102 mmol/L (ref 98–111)
Creatinine, Ser: 1.11 mg/dL (ref 0.61–1.24)
GFR, Estimated: >60 mL/min (ref >60)
Glucose, Bld: 98 mg/dL (ref 70–99)
Potassium: 3.3 mmol/L — ABNORMAL LOW (ref 3.5–5.1)
Sodium: 135 mmol/L (ref 135–145)
Total Bilirubin: 1.5 mg/dL — ABNORMAL HIGH (ref 0.3–1.2)
Total Protein: 5.8 g/dL — ABNORMAL LOW (ref 6.5–8.1)

## 2021-05-29 LAB — CBC WITH DIFFERENTIAL/PLATELET
Abs Immature Granulocytes: 0.02 10*3/uL (ref 0.00–0.07)
Basophils Absolute: 0.1 10*3/uL (ref 0.0–0.1)
Basophils Relative: 1 %
Eosinophils Absolute: 0.1 10*3/uL (ref 0.0–0.5)
Eosinophils Relative: 2 %
HCT: 37.5 % — ABNORMAL LOW (ref 39.0–52.0)
Hemoglobin: 12.2 g/dL — ABNORMAL LOW (ref 13.0–17.0)
Immature Granulocytes: 0 %
Lymphocytes Relative: 18 %
Lymphs Abs: 1.1 10*3/uL (ref 0.7–4.0)
MCH: 30.3 pg (ref 26.0–34.0)
MCHC: 32.5 g/dL (ref 30.0–36.0)
MCV: 93.3 fL (ref 80.0–100.0)
Monocytes Absolute: 0.7 10*3/uL (ref 0.1–1.0)
Monocytes Relative: 12 %
Neutro Abs: 4.1 10*3/uL (ref 1.7–7.7)
Neutrophils Relative %: 67 %
Platelets: 190 10*3/uL (ref 150–400)
RBC: 4.02 MIL/uL — ABNORMAL LOW (ref 4.22–5.81)
RDW: 14.9 % (ref 11.5–15.5)
WBC: 6.2 10*3/uL (ref 4.0–10.5)
nRBC: 0 % (ref 0.0–0.2)

## 2021-05-29 LAB — FERRITIN: Ferritin: 401 ng/mL — ABNORMAL HIGH (ref 24–336)

## 2021-05-29 LAB — PHOSPHORUS: Phosphorus: 3.2 mg/dL (ref 2.5–4.6)

## 2021-05-29 LAB — MAGNESIUM: Magnesium: 1.5 mg/dL — ABNORMAL LOW (ref 1.7–2.4)

## 2021-05-29 LAB — D-DIMER, QUANTITATIVE: D-Dimer, Quant: 1.22 ug/mL-FEU — ABNORMAL HIGH (ref 0.00–0.50)

## 2021-05-29 MED ORDER — ALBUTEROL SULFATE HFA 108 (90 BASE) MCG/ACT IN AERS
2.0000 | INHALATION_SPRAY | Freq: Three times a day (TID) | RESPIRATORY_TRACT | Status: DC
  Administered 2021-05-29: 2 via RESPIRATORY_TRACT

## 2021-05-29 MED ORDER — CHLORHEXIDINE GLUCONATE CLOTH 2 % EX PADS
6.0000 | MEDICATED_PAD | Freq: Every day | CUTANEOUS | Status: DC
  Administered 2021-05-29: 6 via TOPICAL

## 2021-05-29 MED ORDER — LACTATED RINGERS IV SOLN: INTRAVENOUS | Status: DC

## 2021-05-29 MED ORDER — ENSURE ENLIVE PO LIQD: 237.0000 mL | Freq: Two times a day (BID) | ORAL | 12 refills | Status: DC

## 2021-05-29 MED ORDER — ORAL CARE MOUTH RINSE
15.0000 mL | Freq: Two times a day (BID) | OROMUCOSAL | Status: DC
  Administered 2021-05-29: 15 mL via OROMUCOSAL

## 2021-05-29 MED ORDER — NIRMATRELVIR/RITONAVIR (PAXLOVID) TABLET (RENAL DOSING): 2.0000 | ORAL_TABLET | Freq: Two times a day (BID) | ORAL | 0 refills | Status: AC

## 2021-05-29 MED ORDER — MAGNESIUM SULFATE 2 GM/50ML IV SOLN
2.0000 g | Freq: Once | INTRAVENOUS | Status: AC
  Administered 2021-05-29: 2 g via INTRAVENOUS
  Filled 2021-05-29: qty 50

## 2021-05-29 MED ORDER — POTASSIUM CHLORIDE CRYS ER 20 MEQ PO TBCR
40.0000 meq | EXTENDED_RELEASE_TABLET | Freq: Once | ORAL | Status: AC
  Administered 2021-05-29: 40 meq via ORAL
  Filled 2021-05-29: qty 2

## 2021-05-29 MED ORDER — GUAIFENESIN-DM 100-10 MG/5ML PO SYRP: 10.0000 mL | ORAL_SOLUTION | Freq: Four times a day (QID) | ORAL | 0 refills | Status: DC

## 2021-05-29 NOTE — Progress Notes (Signed)
Nsg Discharge Note  Admit Date:  05/28/2021 Discharge date: 05/29/2021   Mitchell [name] to be D/C'd Home per MD order.  AVS completed.  Copy for chart, and copy for patient signed, and dated. Patient/caregiver able to verbalize understanding.  Discharge Medication: Allergies as of 05/29/2021       Reactions   Lisinopril Cough   Losartan Cough   Penicillins Rash        Medication List     TAKE these medications    acetaminophen 500 MG tablet Commonly known as: TYLENOL Take 1,000 mg by mouth every 6 (six) hours as needed.   albuterol 108 (90 Base) MCG/ACT inhaler Commonly known as: VENTOLIN HFA Inhale 1-2 puffs into the lungs every 6 (six) hours as needed for wheezing or shortness of breath.   allopurinol 100 MG tablet Commonly known as: ZYLOPRIM Take 200 mg by mouth daily.   amLODipine 2.5 MG tablet Commonly known as: NORVASC Take 2.5 mg by mouth daily.   aspirin EC 81 MG tablet Take 81 mg by mouth daily.   atorvastatin 10 MG tablet Commonly known as: LIPITOR TAKE 1 TABLET BY MOUTH EVERY DAY   bisoprolol 5 MG tablet Commonly known as: ZEBETA TAKE 1 TABLET BY MOUTH EVERY DAY   colchicine 0.6 MG tablet Take 0.6 mg by mouth 2 (two) times daily as needed (gout).   feeding supplement Liqd Take 237 mLs by mouth 2 (two) times daily between meals. Start taking on: May 30, 2021   fluticasone 50 MCG/ACT nasal spray Commonly known as: FLONASE Place 2 sprays into both nostrils daily as needed for allergies or rhinitis.   gabapentin 300 MG capsule Commonly known as: NEURONTIN Take 600 mg by mouth 3 (three) times daily.   guaiFENesin-dextromethorphan 100-10 MG/5ML syrup Commonly known as: ROBITUSSIN DM Take 10 mLs by mouth every 6 (six) hours.   hydrochlorothiazide 25 MG tablet Commonly known as: HYDRODIURIL TAKE 1 TABLET BY MOUTH EVERY DAY What changed: when to take this   hydrocortisone 2.5 % cream Apply 1 application topically 2 (two) times daily as  needed (irritation).   isosorbide mononitrate 30 MG 24 hr tablet Commonly known as: IMDUR Take 0.5 tablets (15 mg total) by mouth daily.   ketoconazole 2 % cream Commonly known as: NIZORAL Apply topically 2 (two) times daily.   leuprolide (6 Month) 45 MG injection Commonly known as: ELIGARD Inject 45 mg into the skin every 6 (six) months.   meclizine 25 MG tablet Commonly known as: ANTIVERT Take 25 mg by mouth 3 (three) times daily as needed for dizziness.   multivitamin with minerals tablet Take 1 tablet by mouth daily.   nirmatrelvir/ritonavir EUA (renal dosing) 10 x 150 MG & 10 x 100MG  Tabs Commonly known as: PAXLOVID Take 2 tablets by mouth 2 (two) times daily for 4 days. Patient GFR is 47. Take nirmatrelvir (150 mg) one tablet twice daily for 5 days and ritonavir (100 mg) one tablet twice daily for 5 days.   predniSONE 5 MG tablet Commonly known as: DELTASONE Take 5 mg by mouth daily with breakfast.   Systane Balance 0.6 % Soln Generic drug: Propylene Glycol Place 1 drop into both eyes daily as needed (dry eyes).   traMADol 50 MG tablet Commonly known as: ULTRAM Take 25 mg by mouth every 8 (eight) hours as needed for moderate pain.        Discharge Assessment: Vitals:   05/29/21 1200 05/29/21 1300  BP:    Pulse: 80 75  Resp:    Temp:    SpO2: (!) 89% (!) 85%   Skin clean, dry and intact without evidence of skin break down, no evidence of skin tears noted. IV catheter discontinued intact. Site without signs and symptoms of complications - no redness or edema noted at insertion site, patient denies c/o pain - only slight tenderness at site.  Dressing with slight pressure applied.  D/c Instructions-Education: Discharge instructions given to patient/family with verbalized understanding. D/c education completed with patient/family including follow up instructions, medication list, d/c activities limitations if indicated, with other d/c instructions as indicated  by MD - patient able to verbalize understanding, all questions fully answered. Patient instructed to return to ED, call 911, or call MD for any changes in condition.  Patient escorted via [place], and D/C home via private auto.  Carney Corners, RN 05/29/2021 3:21 PM

## 2021-05-29 NOTE — Discharge Summary (Signed)
Physician Discharge Summary  Mitchell [name] YSA:[id number] DOB: [birth year] DOA: 05/28/2021  PCP: Eber Hong, MD  Admit date: 05/28/2021  Discharge date: 05/29/2021  Admitted From:Home  Disposition:  Home  Recommendations for Outpatient Follow-up:  Follow up with PCP in 1-2 weeks Continue Paxlovid as ordered  Home Health:None  Equipment/Devices:None  Discharge Condition:Stable  CODE STATUS: Full  Diet recommendation: Heart Healthy  Brief/Interim Summary:  Mitchell [name] is a 87 y.o. male with medical history significant for CAD, CKD 3, Rh arthritis, HTN, prostate cancer. Patient presented to the ED with complaints of chest pain, congestion and difficulty breathing that started yesterday.  He was admitted with acute hypoxemic respiratory failure secondary to COVID-19 infection. He is now able to eat and drink without difficulty. No other acute events during this hospitalization.  Discharge Diagnoses:  Principal Problem:   COVID-19 virus infection Active Problems:   Acute respiratory failure with hypoxia (HCC)  Principle discharge diagnosis: Acute hypoxemic respiratory failure secondary to COVID pneumonia.  Discharge Instructions  Discharge Instructions     Diet - low sodium heart healthy   Complete by: As directed    Increase activity slowly   Complete by: As directed       Allergies as of 05/29/2021       Reactions   Lisinopril Cough   Losartan Cough   Penicillins Rash        Medication List     TAKE these medications    acetaminophen 500 MG tablet Commonly known as: TYLENOL Take 1,000 mg by mouth every 6 (six) hours as needed.   albuterol 108 (90 Base) MCG/ACT inhaler Commonly known as: VENTOLIN HFA Inhale 1-2 puffs into the lungs every 6 (six) hours as needed for wheezing or shortness of breath.   allopurinol 100 MG tablet Commonly known as: ZYLOPRIM Take 200 mg by mouth daily.   amLODipine 2.5 MG tablet Commonly known as: NORVASC Take 2.5 mg  by mouth daily.   aspirin EC 81 MG tablet Take 81 mg by mouth daily.   atorvastatin 10 MG tablet Commonly known as: LIPITOR TAKE 1 TABLET BY MOUTH EVERY DAY   bisoprolol 5 MG tablet Commonly known as: ZEBETA TAKE 1 TABLET BY MOUTH EVERY DAY   colchicine 0.6 MG tablet Take 0.6 mg by mouth 2 (two) times daily as needed (gout).   feeding supplement Liqd Take 237 mLs by mouth 2 (two) times daily between meals. Start taking on: May 30, 2021   fluticasone 50 MCG/ACT nasal spray Commonly known as: FLONASE Place 2 sprays into both nostrils daily as needed for allergies or rhinitis.   gabapentin 300 MG capsule Commonly known as: NEURONTIN Take 600 mg by mouth 3 (three) times daily.   guaiFENesin-dextromethorphan 100-10 MG/5ML syrup Commonly known as: ROBITUSSIN DM Take 10 mLs by mouth every 6 (six) hours.   hydrochlorothiazide 25 MG tablet Commonly known as: HYDRODIURIL TAKE 1 TABLET BY MOUTH EVERY DAY What changed: when to take this   hydrocortisone 2.5 % cream Apply 1 application topically 2 (two) times daily as needed (irritation).   isosorbide mononitrate 30 MG 24 hr tablet Commonly known as: IMDUR Take 0.5 tablets (15 mg total) by mouth daily.   ketoconazole 2 % cream Commonly known as: NIZORAL Apply topically 2 (two) times daily.   leuprolide (6 Month) 45 MG injection Commonly known as: ELIGARD Inject 45 mg into the skin every 6 (six) months.   meclizine 25 MG tablet Commonly known as: ANTIVERT Take 25 mg by  mouth 3 (three) times daily as needed for dizziness.   multivitamin with minerals tablet Take 1 tablet by mouth daily.   nirmatrelvir/ritonavir EUA (renal dosing) 10 x 150 MG & 10 x 100MG  Tabs Commonly known as: PAXLOVID Take 2 tablets by mouth 2 (two) times daily for 4 days. Patient GFR is 47. Take nirmatrelvir (150 mg) one tablet twice daily for 5 days and ritonavir (100 mg) one tablet twice daily for 5 days.   predniSONE 5 MG tablet Commonly  known as: DELTASONE Take 5 mg by mouth daily with breakfast.   Systane Balance 0.6 % Soln Generic drug: Propylene Glycol Place 1 drop into both eyes daily as needed (dry eyes).   traMADol 50 MG tablet Commonly known as: ULTRAM Take 25 mg by mouth every 8 (eight) hours as needed for moderate pain.        Follow-up Information     Eber Hong, MD. Schedule an appointment as soon as possible for a visit in 1 week(s).   Specialty: Internal Medicine Contact information: [address] [place] [address] [phone]                Allergies  Allergen Reactions   Lisinopril Cough   Losartan Cough   Penicillins Rash    Consultations: None   Procedures/Studies: DG Chest 2 View  Result Date: 05/28/2021 CLINICAL DATA:  Chest pain EXAM: CHEST - 2 VIEW COMPARISON:  Chest x-ray dated April 29, 2021 FINDINGS: Cardiac and mediastinal contours are unchanged. Mild scattered linear opacities which are similar to prior exam. No focal consolidation. No evidence of pleural effusion or pneumothorax. IMPRESSION: Mild scattered linear opacities which are similar to prior exam, likely due to scarring or atelectasis. No focal consolidation. Electronically Signed   By: Yetta Glassman M.D.   On: 05/28/2021 12:19   DG Chest 2 View  Result Date: 04/29/2021 CLINICAL DATA:  Chest pain and shortness of breath for several hours EXAM: CHEST - 2 VIEW COMPARISON:  01/01/2021 FINDINGS: Cardiac shadow is mildly enlarged but stable. Aortic calcifications are seen. Linear scarring is noted in the bases bilaterally. No focal infiltrate or sizable effusion is seen. Changes of prior vertebral augmentation are noted. IMPRESSION: No acute abnormality noted. Electronically Signed   By: Inez Catalina M.D.   On: 04/29/2021 17:08   CT Angio Chest PE W and/or Wo Contrast  Result Date: 05/28/2021 CLINICAL DATA:  Pulmonary emboli suspected. High probability. Chest pain and shortness of breath  beginning yesterday. EXAM: CT ANGIOGRAPHY CHEST WITH CONTRAST TECHNIQUE: Multidetector CT imaging of the chest was performed using the standard protocol during bolus administration of intravenous contrast. Multiplanar CT image reconstructions and MIPs were obtained to evaluate the vascular anatomy. CONTRAST:  6mL OMNIPAQUE IOHEXOL 350 MG/ML SOLN COMPARISON:  Chest radiography same day.  CT angiography 04/29/2021. FINDINGS: Cardiovascular: Heart size is normal. Coronary artery calcification is present. Aortic atherosclerotic calcification is present. Pulmonary arterial opacification is good. There are no pulmonary emboli. Mediastinum/Nodes: No mediastinal or hilar mass or active lymphadenopathy. Chronic calcified nodes in the right hilum. Lungs/Pleura: No evidence of active infiltrate. Chronic calcified granuloma in the right lower lobe. No pleural effusion. Upper Abdomen: Negative Musculoskeletal: Compression deformities at the superior endplates of T3 and T4, unchanged since 1 month ago. Previously augmented fracture at T12, not fully imaged. Review of the MIP images confirms the above findings. IMPRESSION: No pulmonary emboli or other acute chest pathology. Aortic atherosclerosis.  Coronary artery calcification. Calcified granuloma right lower lobe with calcified  right hilar nodes. Previously seen thoracic compression fractures without change or new fracture. Previously seen rib fractures without visible new fracture. Electronically Signed   By: Nelson Chimes M.D.   On: 05/28/2021 16:11   CT Angio Chest Pulmonary Embolism (PE) W or WO Contrast  Result Date: 04/29/2021 CLINICAL DATA:  Chest pain with difficulty breathing, tachycardia, lower extremity swelling. EXAM: CT ANGIOGRAPHY CHEST WITH CONTRAST TECHNIQUE: Multidetector CT imaging of the chest was performed using the standard protocol during bolus administration of intravenous contrast. Multiplanar CT image reconstructions and MIPs were obtained to  evaluate the vascular anatomy. CONTRAST:  117mL OMNIPAQUE IOHEXOL 350 MG/ML SOLN COMPARISON:  Radiograph earlier today.  Chest CT 01/01/2013 FINDINGS: Cardiovascular: There are no filling defects within the pulmonary arteries to suggest pulmonary embolus. Borderline cardiomegaly. There are coronary artery calcification. Thoracic aorta is tortuous. Diffuse calcified and noncalcified atheromatous plaque. Plaque in the descending thoracic aorta is diffusely irregular. No evidence of acute aortic syndrome or penetrating ulcer. No periaortic stranding. Pericardial effusion. Mediastinum/Nodes: Calcified right hilar lymph nodes consistent with prior granulomatous disease. Stable prominent anterior mediastinal lymph node measuring 13 mm, series 4, image 33. No additional enlarged lymph nodes. Tiny hiatal hernia. No suspicious thyroid nodule. Lungs/Pleura: Breathing motion artifact limits detailed assessment. Linear atelectasis or scarring in the anterior right upper lobe. Heterogeneous pulmonary parenchyma. Dependent atelectasis in the lower lobes, right greater than left. No confluent airspace disease or pneumonia. Right lower lobe calcified granuloma. No pleural fluid. No evidence of pulmonary edema. No pulmonary mass. Upper Abdomen: Calcified granuloma in the spleen. No acute upper abdominal findings. Musculoskeletal: Remote fractures of right anterior lower ribs. Sclerotic focus within the left anterior fifth rib was not seen on prior exam. There is a subacute or remote fracture of the left anterior sixth rib with surrounding callus formation. Stable appearance of T4 superior endplate compression fracture. The previous T12 compression fracture is not included in the field of view, however there may be kyphoplasty material. Mild diffuse degenerative change in the spine. Review of the MIP images confirms the above findings. IMPRESSION: 1. No pulmonary embolus. 2. Heterogeneous pulmonary parenchyma, can be seen with small  vessel or small airways disease. 3. Aortic atherosclerosis, with irregular plaque in the descending thoracic aorta, similar to prior exam. No acute aortic findings. 4. Subacute or remote left anterior sixth rib fracture with surrounding callus formation. There is a sclerotic focus within the adjacent anterior left fifth rib, favor prior fracture given fracture and adjacent rib. Possibility of sclerotic metastasis is not entirely excluded in this patient with prostate cancer. 5. Remote right anterior lower rib fractures. Stable T4 superior endplate compression fracture. Aortic Atherosclerosis (ICD10-I70.0). Electronically Signed   By: Keith Rake M.D.   On: 04/29/2021 21:36   ECHOCARDIOGRAM LIMITED  Result Date: 04/30/2021    ECHOCARDIOGRAM LIMITED REPORT   Patient Name:   JODEN BONSALL Date of Exam: 04/30/2021 Medical Rec #:  [id number]    Height:       70.0 in Accession #:    [id number]   Weight:       203.7 lb Date of Birth:  29-Jun-1934     BSA:          2.104 m Patient Age:    32 years     BP:           108/50 mmHg Patient Gender: M            HR:  75 bpm. Exam Location:  [facility] Procedure: Limited Echo Indications:    Chest Pain  History:        Patient has prior history of Echocardiogram examinations, most                 recent 01/16/2021. CAD, Arrythmias:PVC and PAC,                 Signs/Symptoms:Chest Pain and Dyspnea; Risk Factors:Hypertension                 and Dyslipidemia.  Sonographer:    Wenda Low Referring Phys: [id number] [place]  1. Limited study.  2. Left ventricular ejection fraction, by estimation, is 55 to 60%. The left ventricle has normal function. The left ventricle has no regional wall motion abnormalities. There is moderate asymmetric left ventricular hypertrophy of the septal segment. Left ventricular diastolic function could not be evaluated.  3. Right ventricular systolic function is normal. The right ventricular size is normal.  4. The mitral  valve is abnormal, mildly calcified and with mild annular calcification.  5. The aortic valve is tricuspid. There is moderate calcification of the aortic valve. Mild to moderate aortic valve sclerosis/calcification is present, without any evidence of aortic stenosis.  6. Unable to estimate CVP. Comparison(s): Prior images reviewed side by side. LVEF normal range. No pericardial effusion. FINDINGS  Left Ventricle: Left ventricular ejection fraction, by estimation, is 55 to 60%. The left ventricle has normal function. The left ventricle has no regional wall motion abnormalities. The left ventricular internal cavity size was normal in size. There is  moderate asymmetric left ventricular hypertrophy of the septal segment. Left ventricular diastolic function could not be evaluated. Right Ventricle: The right ventricular size is normal. Right ventricular systolic function is normal. Pericardium: There is no evidence of pericardial effusion. Mitral Valve: The mitral valve is abnormal. There is mild calcification of the mitral valve leaflet(s). Mild mitral annular calcification. Aortic Valve: The aortic valve is tricuspid. There is moderate calcification of the aortic valve. There is mild aortic valve annular calcification. Mild to moderate aortic valve sclerosis/calcification is present, without any evidence of aortic stenosis. Aorta: The aortic root is normal in size and structure. Venous: Unable to estimate CVP. The inferior vena cava was not well visualized. IAS/Shunts: No atrial level shunt detected by color flow Doppler. LEFT VENTRICLE PLAX 2D LVIDd:         4.50 cm LVIDs:         3.10 cm LV PW:         1.30 cm LV IVS:        1.10 cm LVOT diam:     1.90 cm LVOT Area:     2.84 cm  LV Volumes (MOD) LV vol d, MOD A2C: 68.6 ml LV vol d, MOD A4C: 77.6 ml LV vol s, MOD A2C: 29.3 ml LV vol s, MOD A4C: 31.5 ml LV SV MOD A2C:     39.3 ml LV SV MOD A4C:     77.6 ml LV SV MOD BP:      44.8 ml LEFT ATRIUM         Index LA diam:     3.90 cm 1.85 cm/m   AORTA Ao Root diam: 3.30 cm  SHUNTS Systemic Diam: 1.90 cm Rozann Lesches MD Electronically signed by Rozann Lesches MD Signature Date/Time: 04/30/2021/4:04:17 PM    Final      Discharge Exam: Vitals:   05/29/21 1200 05/29/21 1300  BP:    Pulse: 80 75  Resp:    Temp:    SpO2: (!) 89% (!) 85%   Vitals:   05/29/21 1124 05/29/21 1127 05/29/21 1200 05/29/21 1300  BP:  136/84    Pulse:  76 80 75  Resp:      Temp: 98.7 F (37.1 C)     TempSrc: Axillary     SpO2:  98% (!) 89% (!) 85%  Weight:      Height:        General: Pt is alert, awake, not in acute distress Cardiovascular: RRR, S1/S2 +, no rubs, no gallops Respiratory: CTA bilaterally, no wheezing, no rhonchi Abdominal: Soft, NT, ND, bowel sounds + Extremities: no edema, no cyanosis    The results of significant diagnostics from this hospitalization (including imaging, microbiology, ancillary and laboratory) are listed below for reference.     Microbiology: Recent Results (from the past 240 hour(s))  Resp Panel by RT-PCR (Flu A&B, Covid) Nasopharyngeal Swab     Status: Abnormal   Collection Time: 05/28/21  1:31 PM   Specimen: Nasopharyngeal Swab; Nasopharyngeal(NP) swabs in vial transport medium  Result Value Ref Range Status   SARS Coronavirus 2 by RT PCR POSITIVE (A) NEGATIVE Final    Comment: RESULT CALLED TO, READ BACK BY AND VERIFIED WITH: WHITE,M AT 8657 ON 10.18.22 BY RUCINSKI,B (NOTE) SARS-CoV-2 target nucleic acids are DETECTED.  The SARS-CoV-2 RNA is generally detectable in upper respiratory specimens during the acute phase of infection. Positive results are indicative of the presence of the identified virus, but do not rule out bacterial infection or co-infection with other pathogens not detected by the test. Clinical correlation with patient history and other diagnostic information is necessary to determine patient infection status. The expected result is Negative.  Fact Sheet  for Patients: EntrepreneurPulse.com.au  Fact Sheet for Healthcare Providers: IncredibleEmployment.be  This test is not yet approved or cleared by the [place] FDA and  has been authorized for detection and/or diagnosis of SARS-CoV-2 by FDA under an Emergency Use Authorization (EUA).  This EUA will remain in effect (meaning this tes t can be used) for the duration of  the COVID-19 declaration under Section 564(b)(1) of the Act, 21 U.S.C. section 360bbb-3(b)(1), unless the authorization is terminated or revoked sooner.     Influenza A by PCR NEGATIVE NEGATIVE Final   Influenza B by PCR NEGATIVE NEGATIVE Final    Comment: (NOTE) The Xpert Xpress SARS-CoV-2/FLU/RSV plus assay is intended as an aid in the diagnosis of influenza from Nasopharyngeal swab specimens and should not be used as a sole basis for treatment. Nasal washings and aspirates are unacceptable for Xpert Xpress SARS-CoV-2/FLU/RSV testing.  Fact Sheet for Patients: EntrepreneurPulse.com.au  Fact Sheet for Healthcare Providers: IncredibleEmployment.be  This test is not yet approved or cleared by the [place] FDA and has been authorized for detection and/or diagnosis of SARS-CoV-2 by FDA under an Emergency Use Authorization (EUA). This EUA will remain in effect (meaning this test can be used) for the duration of the COVID-19 declaration under Section 564(b)(1) of the Act, 21 U.S.C. section 360bbb-3(b)(1), unless the authorization is terminated or revoked.  Performed at [facility], [address], [place], [place] [address]   Blood Culture (routine x 2)     Status: None (Preliminary result)   Collection Time: 05/28/21  5:10 PM   Specimen: BLOOD  Result Value Ref Range Status   Specimen Description   Final    BLOOD LEFT ANTECUBITAL Performed  at [facility], [place] [address], [place], [place] [address]    Special Requests   Final     BOTTLES DRAWN AEROBIC AND ANAEROBIC Blood Culture results may not be optimal due to an excessive volume of blood received in culture bottles   Culture   Final    NO GROWTH < 24 HOURS Performed at [facility], [address] [place], [place] [address]    Report Status PENDING  Incomplete  Blood Culture (routine x 2)     Status: None (Preliminary result)   Collection Time: 05/28/21  5:10 PM   Specimen: BLOOD LEFT HAND  Result Value Ref Range Status   Specimen Description BLOOD LEFT HAND  Final   Special Requests   Final    BOTTLES DRAWN AEROBIC AND ANAEROBIC Blood Culture results may not be optimal due to an excessive volume of blood received in culture bottles   Culture   Final    NO GROWTH < 24 HOURS Performed at [facility], [address] [place], [place] [address]    Report Status PENDING  Incomplete     Labs: BNP (last 3 results) Recent Labs    04/29/21 1611 05/28/21 1157  BNP 99.0 767.3*   Basic Metabolic Panel: Recent Labs  Lab 05/28/21 1157 05/29/21 0450  NA 136 135  K 4.3 3.3*  CL 99 102  CO2 26 25  GLUCOSE 123* 98  BUN 15 14  CREATININE 1.32* 1.11  CALCIUM 9.4 8.6*  MG  --  1.5*  PHOS  --  3.2   Liver Function Tests: Recent Labs  Lab 05/28/21 1710 05/29/21 0450  AST 21 22  ALT 14 14  ALKPHOS 64 60  BILITOT 1.0 1.5*  PROT 6.1* 5.8*  ALBUMIN 3.1* 3.0*   No results for input(s): LIPASE, AMYLASE in the last 168 hours. No results for input(s): AMMONIA in the last 168 hours. CBC: Recent Labs  Lab 05/28/21 1157 05/29/21 0450  WBC 9.1 6.2  NEUTROABS  --  4.1  HGB 14.3 12.2*  HCT 44.1 37.5*  MCV 93.4 93.3  PLT 220 190   Cardiac Enzymes: No results for input(s): CKTOTAL, CKMB, CKMBINDEX, TROPONINI in the last 168 hours. BNP: Invalid input(s): POCBNP CBG: No results for input(s): GLUCAP in the last 168 hours. D-Dimer Recent Labs    05/28/21 1157 05/29/21 0450  DDIMER 1.60* 1.22*   Hgb A1c No results for input(s): HGBA1C in the  last 72 hours. Lipid Profile Recent Labs    05/28/21 1710  TRIG 98   Thyroid function studies No results for input(s): TSH, T4TOTAL, T3FREE, THYROIDAB in the last 72 hours.  Invalid input(s): FREET3 Anemia work up Recent Labs    05/28/21 1710 05/29/21 0450  FERRITIN 401* 401*   Urinalysis No results found for: COLORURINE, APPEARANCEUR, LABSPEC, [place], GLUCOSEU, [place], [place], [place], PROTEINUR, UROBILINOGEN, NITRITE, LEUKOCYTESUR Sepsis Labs Invalid input(s): PROCALCITONIN,  WBC,  LACTICIDVEN Microbiology Recent Results (from the past 240 hour(s))  Resp Panel by RT-PCR (Flu A&B, Covid) Nasopharyngeal Swab     Status: Abnormal   Collection Time: 05/28/21  1:31 PM   Specimen: Nasopharyngeal Swab; Nasopharyngeal(NP) swabs in vial transport medium  Result Value Ref Range Status   SARS Coronavirus 2 by RT PCR POSITIVE (A) NEGATIVE Final    Comment: RESULT CALLED TO, READ BACK BY AND VERIFIED WITH: WHITE,M AT 4193 ON 10.18.22 BY RUCINSKI,B (NOTE) SARS-CoV-2 target nucleic acids are DETECTED.  The SARS-CoV-2 RNA is generally detectable in upper respiratory  specimens during the acute phase of infection. Positive results are indicative of the presence of the identified virus, but do not rule out bacterial infection or co-infection with other pathogens not detected by the test. Clinical correlation with patient history and other diagnostic information is necessary to determine patient infection status. The expected result is Negative.  Fact Sheet for Patients: EntrepreneurPulse.com.au  Fact Sheet for Healthcare Providers: IncredibleEmployment.be  This test is not yet approved or cleared by the [place] FDA and  has been authorized for detection and/or diagnosis of SARS-CoV-2 by FDA under an Emergency Use Authorization (EUA).  This EUA will remain in effect (meaning this tes t can be used) for the duration of  the COVID-19  declaration under Section 564(b)(1) of the Act, 21 U.S.C. section 360bbb-3(b)(1), unless the authorization is terminated or revoked sooner.     Influenza A by PCR NEGATIVE NEGATIVE Final   Influenza B by PCR NEGATIVE NEGATIVE Final    Comment: (NOTE) The Xpert Xpress SARS-CoV-2/FLU/RSV plus assay is intended as an aid in the diagnosis of influenza from Nasopharyngeal swab specimens and should not be used as a sole basis for treatment. Nasal washings and aspirates are unacceptable for Xpert Xpress SARS-CoV-2/FLU/RSV testing.  Fact Sheet for Patients: EntrepreneurPulse.com.au  Fact Sheet for Healthcare Providers: IncredibleEmployment.be  This test is not yet approved or cleared by the [place] FDA and has been authorized for detection and/or diagnosis of SARS-CoV-2 by FDA under an Emergency Use Authorization (EUA). This EUA will remain in effect (meaning this test can be used) for the duration of the COVID-19 declaration under Section 564(b)(1) of the Act, 21 U.S.C. section 360bbb-3(b)(1), unless the authorization is terminated or revoked.  Performed at [facility], [address], [place], [place] [address]   Blood Culture (routine x 2)     Status: None (Preliminary result)   Collection Time: 05/28/21  5:10 PM   Specimen: BLOOD  Result Value Ref Range Status   Specimen Description   Final    BLOOD LEFT ANTECUBITAL Performed at [facility] [address] [place], [place] [address]    Special Requests   Final    BOTTLES DRAWN AEROBIC AND ANAEROBIC Blood Culture results may not be optimal due to an excessive volume of blood received in culture bottles   Culture   Final    NO GROWTH < 24 HOURS Performed at [facility], [address], [place], [place] [address]    Report Status PENDING  Incomplete  Blood Culture (routine x 2)     Status: None (Preliminary result)   Collection Time: 05/28/21  5:10 PM   Specimen: BLOOD LEFT HAND   Result Value Ref Range Status   Specimen Description BLOOD LEFT HAND  Final   Special Requests   Final    BOTTLES DRAWN AEROBIC AND ANAEROBIC Blood Culture results may not be optimal due to an excessive volume of blood received in culture bottles   Culture   Final    NO GROWTH < 24 HOURS Performed at [facility], [address], [place], [place] [address]    Report Status PENDING  Incomplete     Time coordinating discharge: 35 minutes  SIGNED:   Rodena Goldmann, DO [facility] 05/29/2021, 3:05 PM  If 7PM-7AM, please contact night-coverage [email or web address]

## 2021-05-29 NOTE — Plan of Care (Signed)
  Problem: Acute Rehab PT Goals(only PT should resolve) Goal: Pt Will Go Supine/Side To Sit Outcome: Progressing Flowsheets (Taken 05/29/2021 1114) Pt will go Supine/Side to Sit:  Independently  with modified independence Goal: Patient Will Transfer Sit To/From Stand Outcome: Progressing Flowsheets (Taken 05/29/2021 1114) Patient will transfer sit to/from stand:  with modified independence  with supervision Goal: Pt Will Transfer Bed To Chair/Chair To Bed Outcome: Progressing Flowsheets (Taken 05/29/2021 1114) Pt will Transfer Bed to Chair/Chair to Bed: with modified independence Goal: Pt Will Ambulate Outcome: Progressing Flowsheets (Taken 05/29/2021 1114) Pt will Ambulate:  50 feet  with modified independence  with rolling walker  11:14 AM, 05/29/21 Lonell Grandchild, MPT Physical Therapist with [facility] [phone] office [phone] mobile phone

## 2021-05-29 NOTE — TOC Initial Note (Signed)
Transition of Care [facility]) - Initial/Assessment Note    Patient Details  Name: Mitchell [name] MRN: [id number] Date of Birth: [birth year]  Transition of Care (TOC) CM/SW Contact:    Joaquin Courts, RN Phone Number: 05/29/2021, 4:21 PM  Clinical Narrative:                 Referral placed for [name] PT with [place] home health in [place].   Expected Discharge Plan: [place] Barriers to Discharge: No Barriers Identified   Patient Goals and CMS Choice Patient states their goals for this hospitalization and ongoing recovery are:: to go home CMS Medicare.gov Compare Post Acute Care list provided to:: Patient Choice offered to / list presented to : Patient  Expected Discharge Plan and Services Expected Discharge Plan: [place]   Discharge Planning Services: CM Consult Post Acute Care Choice: [place] arrangements for the past 2 months: Single Family Home Expected Discharge Date: 05/29/21                         HH Arranged: PT HH Agency: [place], [place], [place]) Date [facility] Agency Contacted: 05/29/21 Time HH Agency Contacted: [phone] Representative spoke with at [place]: [place]  Prior Living Arrangements/Services Living arrangements for the past 2 months: [place] with:: Spouse Patient language and need for interpreter reviewed:: Yes Do you feel safe going back to the place where you live?: Yes      Need for Family Participation in Patient Care: No (Comment) Care giver support system in place?: Yes (comment)   Criminal Activity/Legal Involvement Pertinent to Current Situation/Hospitalization: No - Comment as needed  Activities of Daily Living Home Assistive Devices/Equipment: Cane (specify quad or straight), Walker (specify type) ADL Screening (condition at time of admission) Patient's cognitive ability adequate to safely complete daily activities?: Yes Is the patient deaf or  have difficulty hearing?: Yes Does the patient have difficulty seeing, even when wearing glasses/contacts?: No Does the patient have difficulty concentrating, remembering, or making decisions?: Yes Patient able to express need for assistance with ADLs?: Yes Does the patient have difficulty dressing or bathing?: No Independently performs ADLs?: Yes (appropriate for developmental age) Does the patient have difficulty walking or climbing stairs?: Yes Weakness of Legs: Both Weakness of Arms/Hands: Both  Permission Sought/Granted                  Emotional Assessment Appearance:: Appears stated age         Psych Involvement: No (comment)  Admission diagnosis:  Weakness [R53.1] Hypoxia [R09.02] COVID-19 virus infection [U07.1] COVID-19 [U07.1] Patient Active Problem List   Diagnosis Date Noted   COVID-19 virus infection 05/28/2021   Acute respiratory failure with hypoxia ([place]) 05/28/2021   Chest pain 04/29/2021   Fracture of twelfth thoracic vertebra ([place]) 04/29/2021   Lumbar spondylosis 04/29/2021   Spondylosis, lumbosacral 04/29/2021   Thoracic vertebral fracture ([place]) 03/14/2021   S/P kyphoplasty 03/14/2021   Elevated blood-pressure reading, without diagnosis of hypertension 02/27/2021   [place] compression fracture of t11-T12 vertebra, subsequent encounter for fracture with delayed healing 02/27/2021   Compression fracture of T12 vertebra ([place]) 02/18/2021   Reactive airway disease with wheezing without complication 94/17/4081   Weakness of both lower extremities 01/21/2021   Obesity (BMI 30.0-34.9) 01/07/2021   Chronic low back pain 11/22/2020   Degeneration of lumbar intervertebral disc 11/22/2020   Degenerative lumbar spinal stenosis 11/22/2020  Lumbago with sciatica, right side 11/22/2020   Lumbar radiculopathy 11/22/2020   Spondylolisthesis of lumbar region 11/22/2020   Other chronic pain 11/20/2020   Squamous cell carcinoma of scalp 05/08/2020   Coronary artery  disease involving native coronary artery of native heart without angina pectoris 12/09/2019   Atypical angina ([place]) 12/08/2019   DOE (dyspnea on exertion) 12/08/2019   Accelerating angina (HCC)    PAC (premature atrial contraction) 09/28/2019   PVC's (premature ventricular contractions) 09/28/2019   Sinus tachycardia 10/01/2018   Prediabetes 08/29/2018   Bilateral hip joint arthritis 09/26/2016   Stage 3b chronic kidney disease ([place]) 05/16/2016   Constipation 05/07/2015   Olecranon bursitis, left elbow 04/12/2015   Left wrist fracture 01/19/2014   Localized edema 11/09/2013   Neutropenia ([place]) 05/31/2013   Arthritis 04/13/2013   Bone metastases ([place]) 04/13/2013   Prostate cancer ([place]) 03/17/2013   Scoliosis 02/02/2013   Arthritis, lumbar spine 02/02/2013   Impacted cerumen 01/01/2012   Sensorineural hearing loss, bilateral 01/01/2012   Seronegative rheumatoid arthritis ([place]) 05/04/2011   Encounter for long-term (current) use of medications 12/16/2010   Benign essential hypertension 08/31/2009   S/P bilateral inguinal hernia repair 08/29/2009   S/P tympanic tube insertion 08/29/2009   S/P vasectomy 08/29/2009   Internal hemorrhoids 11/16/2006   Contracture of palmar fascia 10/17/2005   Herpes zoster 09/01/2005   Inguinal hernia 06/01/2005   Polyp of colon 11/17/2001   Hyperlipidemia 10/27/2001   Gastroesophageal reflux disease 09/04/2000   Idiopathic chronic gout of right foot without tophus 01/07/2000   Allergic rhinitis due to other allergen 12/16/1999   PCP:  Eber Hong, MD Pharmacy:   [id number] - [place], [place] [address] [place] [address] Phone: [phone] Fax: [phone]     Social Determinants of Health (SDOH) Interventions    Readmission Risk Interventions No flowsheet data found.

## 2021-05-29 NOTE — Progress Notes (Signed)
PROGRESS NOTE    Mitchell [name]  Mitchell [name] DOB: [birth year] DOA: 05/28/2021 PCP: Eber Hong, MD   Brief Narrative:   Mitchell [name] is a 87 y.o. male with medical history significant for CAD, CKD 3, Rh arthritis, HTN, prostate cancer. Patient presented to the ED with complaints of chest pain, congestion and difficulty breathing that started yesterday.  He was admitted with acute hypoxemic respiratory failure secondary to COVID-19 infection.  He continues to have difficulty with eating and drinking and states that he has no appetite.  Assessment & Plan:   Principal Problem:   COVID-19 virus infection Active Problems:   Acute respiratory failure with hypoxia (HCC)   Acute hypoxemic respiratory failure secondary to COVID-pneumonia -Patient has had 3 prior COVID vaccines -Continue Paxlovid -DuoNebs as needed -Mucolytic's -PT evaluation pending -Anorexia secondary to above with need to observe on IV fluids until this improves  Chest pain-resolved -History of nonobstructive CAD -Continue to monitor on telemetry, okay for transfer  Hypertension -Currently stable, holding bisoprolol, HCTZ, and Lasix  Hypomagnesemia/hypokalemia -Replete and reevaluate in a.m.  History of prostate cancer -Currently on leuprolide injections and follows with oncology at [facility]  Gout -Allopurinol  Seronegative rheumatoid arthritis -Continue prednisone 5 mg daily  Chronic back pain -Secondary to chronic compression deformities T3/T4-unchanged   DVT prophylaxis: Lovenox Code Status: Full Family Communication: None at bedside Disposition Plan:  Status is: Observation  The patient will require care spanning > 2 midnights and should be moved to inpatient because: He requires current IV medications to include some IV fluid until appetite improves.   Consultants:  None  Procedures:  None  Antimicrobials:  Anti-infectives (From admission, onward)    Start     Dose/Rate Route Frequency  Ordered Stop   05/28/21 2200  nirmatrelvir/ritonavir EUA (renal dosing) (PAXLOVID) 2 tablet       Note to Pharmacy: Please adjust dose as indicated. Thanks.   2 tablet Oral 2 times daily 05/28/21 1921 06/02/21 2159      Subjective: Patient seen and evaluated today with ongoing significant weakness and lack of appetite.  Objective: Vitals:   05/29/21 0500 05/29/21 0600 05/29/21 0700 05/29/21 1000  BP:      Pulse: 77 78 80 87  Resp: 14 20    Temp:      TempSrc:      SpO2: 93% (!) 80% (!) 88% 96%  Weight:      Height:        Intake/Output Summary (Last 24 hours) at 05/29/2021 1059 Last data filed at 05/29/2021 0600 Gross per 24 hour  Intake --  Output 5 ml  Net -5 ml   Filed Weights   05/28/21 1219  Weight: 95.3 kg    Examination:  General exam: Appears calm and comfortable  Respiratory system: Clear to auscultation. Respiratory effort normal. Cardiovascular system: S1 & S2 heard, RRR.  Gastrointestinal system: Abdomen is soft Central nervous system: Alert and awake Extremities: No edema Skin: No significant lesions noted Psychiatry: Flat affect.    Data Reviewed: I have personally reviewed following labs and imaging studies  CBC: Recent Labs  Lab 05/28/21 1157 05/29/21 0450  WBC 9.1 6.2  NEUTROABS  --  4.1  HGB 14.3 12.2*  HCT 44.1 37.5*  MCV 93.4 93.3  PLT 220 664   Basic Metabolic Panel: Recent Labs  Lab 05/28/21 1157 05/29/21 0450  NA 136 135  K 4.3 3.3*  CL 99 102  CO2 26 25  GLUCOSE 123* 98  BUN 15 14  CREATININE 1.32* 1.11  CALCIUM 9.4 8.6*  MG  --  1.5*  PHOS  --  3.2   GFR: Estimated Creatinine Clearance: 55.4 mL/min (by C-G formula based on SCr of 1.11 mg/dL). Liver Function Tests: Recent Labs  Lab 05/28/21 1710 05/29/21 0450  AST 21 22  ALT 14 14  ALKPHOS 64 60  BILITOT 1.0 1.5*  PROT 6.1* 5.8*  ALBUMIN 3.1* 3.0*   No results for input(s): LIPASE, AMYLASE in the last 168 hours. No results for input(s): AMMONIA in the  last 168 hours. Coagulation Profile: No results for input(s): INR, PROTIME in the last 168 hours. Cardiac Enzymes: No results for input(s): CKTOTAL, CKMB, CKMBINDEX, TROPONINI in the last 168 hours. BNP (last 3 results) No results for input(s): PROBNP in the last 8760 hours. HbA1C: No results for input(s): HGBA1C in the last 72 hours. CBG: No results for input(s): GLUCAP in the last 168 hours. Lipid Profile: Recent Labs    05/28/21 1710  TRIG 98   Thyroid Function Tests: No results for input(s): TSH, T4TOTAL, FREET4, T3FREE, THYROIDAB in the last 72 hours. Anemia Panel: Recent Labs    05/28/21 1710 05/29/21 0450  FERRITIN 401* 401*   Sepsis Labs: Recent Labs  Lab 05/28/21 1710  PROCALCITON <0.10  LATICACIDVEN 1.1    Recent Results (from the past 240 hour(s))  Resp Panel by RT-PCR (Flu A&B, Covid) Nasopharyngeal Swab     Status: Abnormal   Collection Time: 05/28/21  1:31 PM   Specimen: Nasopharyngeal Swab; Nasopharyngeal(NP) swabs in vial transport medium  Result Value Ref Range Status   SARS Coronavirus 2 by RT PCR POSITIVE (A) NEGATIVE Final    Comment: RESULT CALLED TO, READ BACK BY AND VERIFIED WITH: WHITE,M AT 1448 ON 10.18.22 BY RUCINSKI,B (NOTE) SARS-CoV-2 target nucleic acids are DETECTED.  The SARS-CoV-2 RNA is generally detectable in upper respiratory specimens during the acute phase of infection. Positive results are indicative of the presence of the identified virus, but do not rule out bacterial infection or co-infection with other pathogens not detected by the test. Clinical correlation with patient history and other diagnostic information is necessary to determine patient infection status. The expected result is Negative.  Fact Sheet for Patients: EntrepreneurPulse.com.au  Fact Sheet for Healthcare Providers: IncredibleEmployment.be  This test is not yet approved or cleared by the [place] FDA and  has  been authorized for detection and/or diagnosis of SARS-CoV-2 by FDA under an Emergency Use Authorization (EUA).  This EUA will remain in effect (meaning this tes t can be used) for the duration of  the COVID-19 declaration under Section 564(b)(1) of the Act, 21 U.S.C. section 360bbb-3(b)(1), unless the authorization is terminated or revoked sooner.     Influenza A by PCR NEGATIVE NEGATIVE Final   Influenza B by PCR NEGATIVE NEGATIVE Final    Comment: (NOTE) The Xpert Xpress SARS-CoV-2/FLU/RSV plus assay is intended as an aid in the diagnosis of influenza from Nasopharyngeal swab specimens and should not be used as a sole basis for treatment. Nasal washings and aspirates are unacceptable for Xpert Xpress SARS-CoV-2/FLU/RSV testing.  Fact Sheet for Patients: EntrepreneurPulse.com.au  Fact Sheet for Healthcare Providers: IncredibleEmployment.be  This test is not yet approved or cleared by the [place] FDA and has been authorized for detection and/or diagnosis of SARS-CoV-2 by FDA under an Emergency Use Authorization (EUA). This EUA will remain in effect (meaning this test can be used) for the duration of the COVID-19 declaration under  Section 564(b)(1) of the Act, 21 U.S.C. section 360bbb-3(b)(1), unless the authorization is terminated or revoked.  Performed at [facility], [address], [place], [place] [address]   Blood Culture (routine x 2)     Status: None (Preliminary result)   Collection Time: 05/28/21  5:10 PM   Specimen: BLOOD  Result Value Ref Range Status   Specimen Description   Final    BLOOD LEFT ANTECUBITAL Performed at [facility] [address], [place], [place] [address]    Special Requests   Final    BOTTLES DRAWN AEROBIC AND ANAEROBIC Blood Culture results may not be optimal due to an excessive volume of blood received in culture bottles   Culture   Final    NO GROWTH < 24 HOURS Performed at [facility], [address] [place], [place] [address]    Report Status PENDING  Incomplete  Blood Culture (routine x 2)     Status: None (Preliminary result)   Collection Time: 05/28/21  5:10 PM   Specimen: BLOOD LEFT HAND  Result Value Ref Range Status   Specimen Description BLOOD LEFT HAND  Final   Special Requests   Final    BOTTLES DRAWN AEROBIC AND ANAEROBIC Blood Culture results may not be optimal due to an excessive volume of blood received in culture bottles   Culture   Final    NO GROWTH < 24 HOURS Performed at [facility], [address] [place], [place] [address]    Report Status PENDING  Incomplete         Radiology Studies: DG Chest 2 View  Result Date: 05/28/2021 CLINICAL DATA:  Chest pain EXAM: CHEST - 2 VIEW COMPARISON:  Chest x-ray dated April 29, 2021 FINDINGS: Cardiac and mediastinal contours are unchanged. Mild scattered linear opacities which are similar to prior exam. No focal consolidation. No evidence of pleural effusion or pneumothorax. IMPRESSION: Mild scattered linear opacities which are similar to prior exam, likely due to scarring or atelectasis. No focal consolidation. Electronically Signed   By: Yetta Glassman M.D.   On: 05/28/2021 12:19   CT Angio Chest PE W and/or Wo Contrast  Result Date: 05/28/2021 CLINICAL DATA:  Pulmonary emboli suspected. High probability. Chest pain and shortness of breath beginning yesterday. EXAM: CT ANGIOGRAPHY CHEST WITH CONTRAST TECHNIQUE: Multidetector CT imaging of the chest was performed using the standard protocol during bolus administration of intravenous contrast. Multiplanar CT image reconstructions and MIPs were obtained to evaluate the vascular anatomy. CONTRAST:  47mL OMNIPAQUE IOHEXOL 350 MG/ML SOLN COMPARISON:  Chest radiography same day.  CT angiography 04/29/2021. FINDINGS: Cardiovascular: Heart size is normal. Coronary artery calcification is present. Aortic atherosclerotic calcification is present. Pulmonary  arterial opacification is good. There are no pulmonary emboli. Mediastinum/Nodes: No mediastinal or hilar mass or active lymphadenopathy. Chronic calcified nodes in the right hilum. Lungs/Pleura: No evidence of active infiltrate. Chronic calcified granuloma in the right lower lobe. No pleural effusion. Upper Abdomen: Negative Musculoskeletal: Compression deformities at the superior endplates of T3 and T4, unchanged since 1 month ago. Previously augmented fracture at T12, not fully imaged. Review of the MIP images confirms the above findings. IMPRESSION: No pulmonary emboli or other acute chest pathology. Aortic atherosclerosis.  Coronary artery calcification. Calcified granuloma right lower lobe with calcified right hilar nodes. Previously seen thoracic compression fractures without change or new fracture. Previously seen rib fractures without visible new fracture. Electronically Signed   By: Nelson Chimes M.D.   On: 05/28/2021 16:11  Scheduled Meds:  albuterol  2 puff Inhalation TID   allopurinol  200 mg Oral Daily   aspirin EC  81 mg Oral Daily   Chlorhexidine Gluconate Cloth  6 each Topical Daily   enoxaparin (LOVENOX) injection  40 mg Subcutaneous Q24H   feeding supplement  237 mL Oral BID BM   guaiFENesin-dextromethorphan  10 mL Oral Q6H   mouth rinse  15 mL Mouth Rinse BID   nirmatrelvir/ritonavir EUA (renal dosing)  2 tablet Oral BID   predniSONE  5 mg Oral Q breakfast   Continuous Infusions:  magnesium sulfate bolus IVPB       LOS: 0 days    Time spent: 35 minutes    Mitchell [name] Darleen Crocker, DO [facility]  If 7PM-7AM, please contact night-coverage [email or web address] 05/29/2021, 10:59 AM

## 2021-05-29 NOTE — Evaluation (Signed)
Physical Therapy Evaluation Patient Details Name: Mitchell [name] MRN: [id number] DOB: 02-17-1934 Today's Date: 05/29/2021  History of Present Illness  Mitchell [name] is a 87 y.o. male with medical history significant for CAD, CKD 3, Rh arthritis, HTN, prostate cancer.  Patient presented to the ED with complaints of chest pain, congestion and difficulty breathing that started yesterday.  He also reports a cough productive of clear phlegm.  Chest pain is mid lower chest, he describes it as an upset stomach.  Intermittent and not related to activity or food.  Reports chest pain is different from chest pain that brought him to the hospital last month.  He has chronic intermittent bilateral lower extremity swelling that is not changed from prior.  Spouse reports intermittent wheezing.  No vomiting no loose stools, some very mild abdominal pain.  Reports generalized weakness which is not new and has been present for at least 3 weeks.  He has poor effort tolerance and so unable to stand for any amount of time.  Patient has just returned from from a bus tour of [place].  Patient has been vaccinated x3.   Clinical Impression  Patient functioning near baseline for functional mobility and gait demonstrating good return for transferring from bedside and walking in room without loss of balance.  Patient had difficulty completing sit to stand from commode due to generalized weakness, on room air throughout visit with SpO2 at 93% and tolerated sitting up at bedside after therapy.  Patient will benefit from continued physical therapy in hospital and recommended venue below to increase strength, balance, endurance for safe ADLs and gait.         Recommendations for follow up therapy are one component of a multi-disciplinary discharge planning process, led by the attending physician.  Recommendations may be updated based on patient status, additional functional criteria and insurance authorization.  Follow Up  Recommendations Home health PT;Supervision for mobility/OOB;Supervision - Intermittent    Equipment Recommendations  None recommended by PT    Recommendations for Other Services       Precautions / Restrictions Precautions Precautions: Fall Restrictions Weight Bearing Restrictions: No      Mobility  Bed Mobility Overal bed mobility: Modified Independent                  Transfers Overall transfer level: Needs assistance Equipment used: Rolling walker (2 wheeled) Transfers: Sit to/from [facility] Sit to Stand: Min guard Stand pivot transfers: Min guard       General transfer comment: good return for transferring from bedside, but requires Min/Min guard assist completing sit to stands from commode  Ambulation/Gait Ambulation/Gait assistance: Supervision Gait Distance (Feet): 20 Feet Assistive device: Rolling walker (2 wheeled) Gait Pattern/deviations: Decreased step length - right;Decreased step length - left;Decreased stride length Gait velocity: decreased   General Gait Details: slightly labored slow cadence without loss of balance, limited mostly due to c/o fatigue, on room air with SpO2 at 93%  Stairs            Wheelchair Mobility    Modified Rankin (Stroke Patients Only)       Balance Overall balance assessment: Needs assistance Sitting-balance support: Feet supported;No upper extremity supported Sitting balance-Leahy Scale: Good Sitting balance - Comments: at EOB   Standing balance support: During functional activity;Bilateral upper extremity supported Standing balance-Leahy Scale: Fair Standing balance comment: using RW  Pertinent Vitals/Pain Pain Assessment: No/denies pain    Home Living Family/patient expects to be discharged to:: Private residence   Available Help at Discharge: Family;Available 24 hours/day Type of Home: House Home Access: Stairs to enter Entrance  Stairs-Rails: None Entrance Stairs-Number of Steps: 1-2 Home Layout: One level Home Equipment: Walker - 2 wheels;Bedside commode;Cane - single point      Prior Function Level of Independence: Needs assistance   Gait / Transfers Assistance Needed: Household ambulator using RW  ADL's / Homemaking Assistance Needed: assisted by family        Hand Dominance   Dominant Hand: Right    Extremity/Trunk Assessment   Upper Extremity Assessment Upper Extremity Assessment: Overall WFL for tasks assessed    Lower Extremity Assessment Lower Extremity Assessment: Generalized weakness    Cervical / Trunk Assessment Cervical / Trunk Assessment: Kyphotic  Communication   Communication: No difficulties  Cognition Arousal/Alertness: Awake/alert Behavior During Therapy: WFL for tasks assessed/performed Overall Cognitive Status: Within Functional Limits for tasks assessed                                        General Comments      Exercises     Assessment/Plan    PT Assessment Patient needs continued PT services  PT Problem List Decreased strength;Decreased activity tolerance;Decreased balance;Decreased mobility       PT Treatment Interventions DME instruction;Gait training;Stair training;Functional mobility training;Therapeutic activities;Therapeutic exercise;Balance training;Patient/family education    PT Goals (Current goals can be found in the Care Plan section)  Acute Rehab PT Goals Patient Stated Goal: return home with family to assist PT Goal Formulation: With patient Time For Goal Achievement: 06/05/21 Potential to Achieve Goals: Good    Frequency Min 2X/week   Barriers to discharge        Co-evaluation               AM-PAC PT "6 Clicks" Mobility  Outcome Measure Help needed turning from your back to your side while in a flat bed without using bedrails?: None Help needed moving from lying on your back to sitting on the side of a flat  bed without using bedrails?: None Help needed moving to and from a bed to a chair (including a wheelchair)?: A Little Help needed standing up from a chair using your arms (e.g., wheelchair or bedside chair)?: A Little Help needed to walk in hospital room?: A Little Help needed climbing 3-5 steps with a railing? : A Lot 6 Click Score: 19    End of Session   Activity Tolerance: Patient tolerated treatment well;Patient limited by fatigue Patient left: in bed;with call bell/phone within reach;Other (comment) (left seated at EOB) Nurse Communication: Mobility status PT Visit Diagnosis: Unsteadiness on feet (R26.81);Other abnormalities of gait and mobility (R26.89);Muscle weakness (generalized) (M62.81)    Time: 5188-4166 PT Time Calculation (min) (ACUTE ONLY): 25 min   Charges:   PT Evaluation $PT Eval Moderate Complexity: 1 Mod PT Treatments $Therapeutic Activity: 23-37 mins        11:12 AM, 05/29/21 Lonell Grandchild, MPT Physical Therapist with [facility] 336 [phone] office [phone] mobile phone

## 2021-06-02 LAB — CULTURE, BLOOD (ROUTINE X 2)
Culture: NO GROWTH
Culture: NO GROWTH

## 2021-07-02 ENCOUNTER — Ambulatory Visit (INDEPENDENT_AMBULATORY_CARE_PROVIDER_SITE_OTHER): Payer: Medicare Other | Admitting: Cardiology

## 2021-07-02 ENCOUNTER — Encounter: Payer: Self-pay | Admitting: Cardiology

## 2021-07-02 VITALS — BP 128/70 | HR 72 | Ht 69.0 in | Wt 204.0 lb

## 2021-07-02 DIAGNOSIS — I249 Acute ischemic heart disease, unspecified: Secondary | ICD-10-CM | POA: Diagnosis not present

## 2021-07-02 DIAGNOSIS — I25119 Atherosclerotic heart disease of native coronary artery with unspecified angina pectoris: Secondary | ICD-10-CM | POA: Diagnosis not present

## 2021-07-02 DIAGNOSIS — I493 Ventricular premature depolarization: Secondary | ICD-10-CM | POA: Diagnosis not present

## 2021-07-02 NOTE — Progress Notes (Signed)
Cardiology Office Note  Date: 07/02/2021   ID: Mitchell [name], DOB 06-Mar-1934, MRN [id number]  PCP:  Eber Hong, MD  Cardiologist:  Rozann Lesches, MD Electrophysiologist:  None   Chief Complaint  Patient presents with   Cardiac follow-up    History of Present Illness: Mitchell [name] is an 87 y.o. male last seen in June.  He is here with his wife for a follow-up visit. He was recently hospitalized in October with COVID-19 and acute hypoxic respiratory failure, I reviewed the discharge summary.  Limited echocardiogram in September revealed LVEF 55 to 60%.  He feels much better, still not back to baseline however.  He does not describe any obvious angina or palpitations.  I reviewed his medications which are noted below.  He has had intermittent leg swelling and was prescribed as needed low-dose Lasix by PCP.  He is using this every other day.  Past Medical History:  Diagnosis Date   Allergic rhinitis    CAD (coronary artery disease)    Moderate, nonobstructive disease   Dupuytren's contracture    Right hand   Essential hypertension    GERD (gastroesophageal reflux disease)    Gout    Herpes zoster    twice   Hyperlipidemia    Idiopathic scoliosis in adult patient    Inguinal hernia    Right sided   Internal hemorrhoids    Lumbosacral spondylosis    Neuropathy    Pre-diabetes    Prostate cancer ([place])    with bone mets- has been treated with chemo    Past Surgical History:  Procedure Laterality Date   EYE SURGERY Bilateral    cataract   HERNIA REPAIR Bilateral    INTRAVASCULAR PRESSURE WIRE/FFR STUDY N/A 12/08/2019   Procedure: INTRAVASCULAR PRESSURE WIRE/FFR STUDY;  Surgeon: Leonie Man, MD;  Location: [facility];  Service: Cardiovascular;  Laterality: N/A;   KYPHOPLASTY N/A 03/14/2021   Procedure: Thoracic Twelve  KYPHOPLASTY;  Surgeon: Erline Levine, MD;  Location: [facility];  Service: Neurosurgery;  Laterality: N/A;  3C/RM 21   LEFT HEART CATH AND  CORONARY ANGIOGRAPHY N/A 12/08/2019   Procedure: LEFT HEART CATH AND CORONARY ANGIOGRAPHY;  Surgeon: Leonie Man, MD;  Location: [facility];  Service: Cardiovascular;  Laterality: N/A;   TYMPANIC MEMBRANE REPAIR     VASECTOMY      Current Outpatient Medications  Medication Sig Dispense Refill   acetaminophen (TYLENOL) 500 MG tablet Take 1,000 mg by mouth every 6 (six) hours as needed.     albuterol (VENTOLIN HFA) 108 (90 Base) MCG/ACT inhaler Inhale 1-2 puffs into the lungs every 6 (six) hours as needed for wheezing or shortness of breath.     allopurinol (ZYLOPRIM) 100 MG tablet Take 200 mg by mouth daily.      amLODipine (NORVASC) 2.5 MG tablet Take 2.5 mg by mouth daily.     aspirin EC 81 MG tablet Take 81 mg by mouth daily.     atorvastatin (LIPITOR) 10 MG tablet TAKE 1 TABLET BY MOUTH EVERY DAY (Patient taking differently: Take 10 mg by mouth daily.) 90 tablet 1   atorvastatin (LIPITOR) 10 MG tablet Take by mouth.     bisoprolol (ZEBETA) 5 MG tablet TAKE 1 TABLET BY MOUTH EVERY DAY 30 tablet 6   colchicine 0.6 MG tablet Take 0.6 mg by mouth 2 (two) times daily as needed (gout).     feeding supplement (ENSURE ENLIVE / ENSURE PLUS) LIQD Take  237 mLs by mouth 2 (two) times daily between meals. 237 mL 12   fluticasone (FLONASE) 50 MCG/ACT nasal spray Place 2 sprays into both nostrils daily as needed for allergies or rhinitis.     gabapentin (NEURONTIN) 300 MG capsule Take 600 mg by mouth 3 (three) times daily.      guaiFENesin-dextromethorphan (ROBITUSSIN DM) 100-10 MG/5ML syrup Take 10 mLs by mouth every 6 (six) hours. 118 mL 0   hydrochlorothiazide (HYDRODIURIL) 25 MG tablet TAKE 1 TABLET BY MOUTH EVERY DAY (Patient taking differently: Take 25 mg by mouth 2 (two) times daily.) 90 tablet 3   hydrocortisone 2.5 % cream Apply 1 application topically 2 (two) times daily as needed (irritation).     isosorbide mononitrate (IMDUR) 30 MG 24 hr tablet Take 0.5 tablets (15 mg total) by  mouth daily. 30 tablet 1   ketoconazole (NIZORAL) 2 % cream Apply topically 2 (two) times daily.     leuprolide, 6 Month, (ELIGARD) 45 MG injection Inject 45 mg into the skin every 6 (six) months.     meclizine (ANTIVERT) 25 MG tablet Take 25 mg by mouth 3 (three) times daily as needed for dizziness.     Multiple Vitamins-Minerals (MULTIVITAMIN WITH MINERALS) tablet Take 1 tablet by mouth daily.     predniSONE (DELTASONE) 5 MG tablet Take 5 mg by mouth daily with breakfast.     Propylene Glycol (SYSTANE BALANCE) 0.6 % SOLN Place 1 drop into both eyes daily as needed (dry eyes).     traMADol (ULTRAM) 50 MG tablet Take 25 mg by mouth every 8 (eight) hours as needed for moderate pain.     No current facility-administered medications for this visit.   Allergies:  Lisinopril, Losartan, and Penicillins   ROS: No palpitations or syncope.  Physical Exam: VS:  BP 128/70   Pulse 72   Ht 5\' 9"  (1.753 m)   Wt 204 lb (92.5 kg)   SpO2 95%   BMI 30.13 kg/m , BMI Body mass index is 30.13 kg/m.  Wt Readings from Last 3 Encounters:  07/02/21 204 lb (92.5 kg)  05/28/21 210 lb (95.3 kg)  04/29/21 203 lb 11.3 oz (92.4 kg)    General: Patient appears comfortable at rest. HEENT: Conjunctiva and lids normal, wearing a mask. Neck: Supple, no elevated JVP or carotid bruits, no thyromegaly. Lungs: Clear to auscultation, nonlabored breathing at rest. Cardiac: Regular rate and rhythm, no S3, 1/6 systolic murmur, no pericardial rub. Extremities: Mild lower leg edema.  ECG:  An ECG dated 05/28/2021 was personally reviewed today and demonstrated:  Sinus rhythm with prolonged PR interval, increased voltage, decreased R wave progression, and frequent PVCs.  Recent Labwork: 01/21/2021: TSH 1.140 05/28/2021: B Natriuretic Peptide 136.0 05/29/2021: ALT 14; AST 22; BUN 14; Creatinine, Ser 1.11; Hemoglobin 12.2; Magnesium 1.5; Platelets 190; Potassium 3.3; Sodium 135     Component Value Date/Time   TRIG 98  05/28/2021 1710    Other Studies Reviewed Today:  Cardiac catheterization 12/08/2019: The left ventricular systolic function is normal. The left ventricular ejection fraction is 50-55% by visual estimate. LV end diastolic pressure is moderately elevated. There is no aortic valve stenosis. Prox LAD lesion is 55% stenosed with 60% stenosed side branch in 1st Diag. Both lesions were tested with RFR/DFR and found to be not physiologically significant. Dist LAD lesion is 60% stenosed.   Angiographically moderate-severe single-vessel disease with ostial LAD eccentrically calcified 55-60% stenosis at takeoff of high branching 1st Diag -> RFR/DFR negative for  both LAD and Diag; mid LAD focal 60% and hinge point Otherwise minimal CAD in a right dominant system. Normal LVEF and mild-moderately elevated EDP.   Angiographic imaging correlates with noninvasive nuclear stress test imaging. ->  No obvious culprit lesion.  Despite having lesions in the LAD, there was no evidence of anterior ischemia on the Myoview, this correlates with DFR/FFR results.   Echocardiogram 04/30/2021:  1. Limited study.   2. Left ventricular ejection fraction, by estimation, is 55 to 60%. The  left ventricle has normal function. The left ventricle has no regional  wall motion abnormalities. There is moderate asymmetric left ventricular  hypertrophy of the septal segment.  Left ventricular diastolic function could not be evaluated.   3. Right ventricular systolic function is normal. The right ventricular  size is normal.   4. The mitral valve is abnormal, mildly calcified and with mild annular  calcification.   5. The aortic valve is tricuspid. There is moderate calcification of the  aortic valve. Mild to moderate aortic valve sclerosis/calcification is  present, without any evidence of aortic stenosis.   6. Unable to estimate CVP.   Assessment and Plan:  1.  History of frequent PVCs.  Tolerating bisoprolol and heart  rate regular today.  No palpitations or syncope.  Follow-up echocardiogram in September revealed LVEF 55 to 60%.  2.  Moderate nonobstructive CAD by cardiac catheterization in April of last year.  No active angina at this time.  Continue observation on medical therapy did he is currently on aspirin, Norvasc, bisoprolol, Imdur, and Lipitor.  Medication Adjustments/Labs and Tests Ordered: Current medicines are reviewed at length with the patient today.  Concerns regarding medicines are outlined above.   Tests Ordered: No orders of the defined types were placed in this encounter.   Medication Changes: No orders of the defined types were placed in this encounter.   Disposition:  Follow up  6 months.  Signed, Satira Sark, MD, [facility] 07/02/2021 4:12 PM    [facility] [place], [place], [place] [address] Phone: [phone]; Fax: [phone]

## 2021-07-02 NOTE — Patient Instructions (Addendum)

## 2021-08-19 ENCOUNTER — Other Ambulatory Visit: Payer: Self-pay | Admitting: Cardiology

## 2021-08-26 ENCOUNTER — Telehealth: Payer: Self-pay

## 2021-08-26 MED ORDER — ATORVASTATIN CALCIUM 10 MG PO TABS: 10.0000 mg | ORAL_TABLET | Freq: Every day | ORAL | 3 refills | Status: DC

## 2021-08-26 NOTE — Telephone Encounter (Signed)
Medication refill request approved for Atorvastatin 10 mg tablets and sent to [facility] per pt request.

## 2021-08-30 ENCOUNTER — Telehealth: Payer: Self-pay | Admitting: Cardiology

## 2021-08-30 NOTE — Telephone Encounter (Signed)
Pt c/o medication issue:  1. Name of Medication: furosemide   2. How are you currently taking this medication (dosage and times per day)? 20mg  by mouth   3. Are you having a reaction (difficulty breathing--STAT)? no  4. What is your medication issue? Patient was calling in to get a refill but it not showing for his current medication. Please advise

## 2021-08-30 NOTE — Telephone Encounter (Signed)
Reports he was told by provider at last visit to stop hctz and restart furosemide 20 mg daily. Say she has been taking lasix 20 mg daily since November. Says his has almost no swelling since taking lasix. Explained to patient his plan from that visit does not mention this although it did mention that his PCP had given him low dose lasix to take every other day. Advised that this would be sent to his provider for recommendations. Advised that it will be next week before he will get a response. Verbalized understanding.

## 2021-09-03 MED ORDER — FUROSEMIDE 20 MG PO TABS: 20.0000 mg | ORAL_TABLET | Freq: Every day | ORAL | 1 refills | Status: DC

## 2021-09-03 NOTE — Telephone Encounter (Signed)
Patient informed and verbalized understanding of plan. Recent CMET 07/23/21 w/potassium available in [place]

## 2021-12-07 IMAGING — DX DG CHEST 2V
2 series · 2 of 2 positions shown · non-contrast
Comparison: 01/01/2021

CLINICAL DATA: Chest pain and shortness of breath for several hours

EXAM:
CHEST - 2 VIEW

[chest lat]
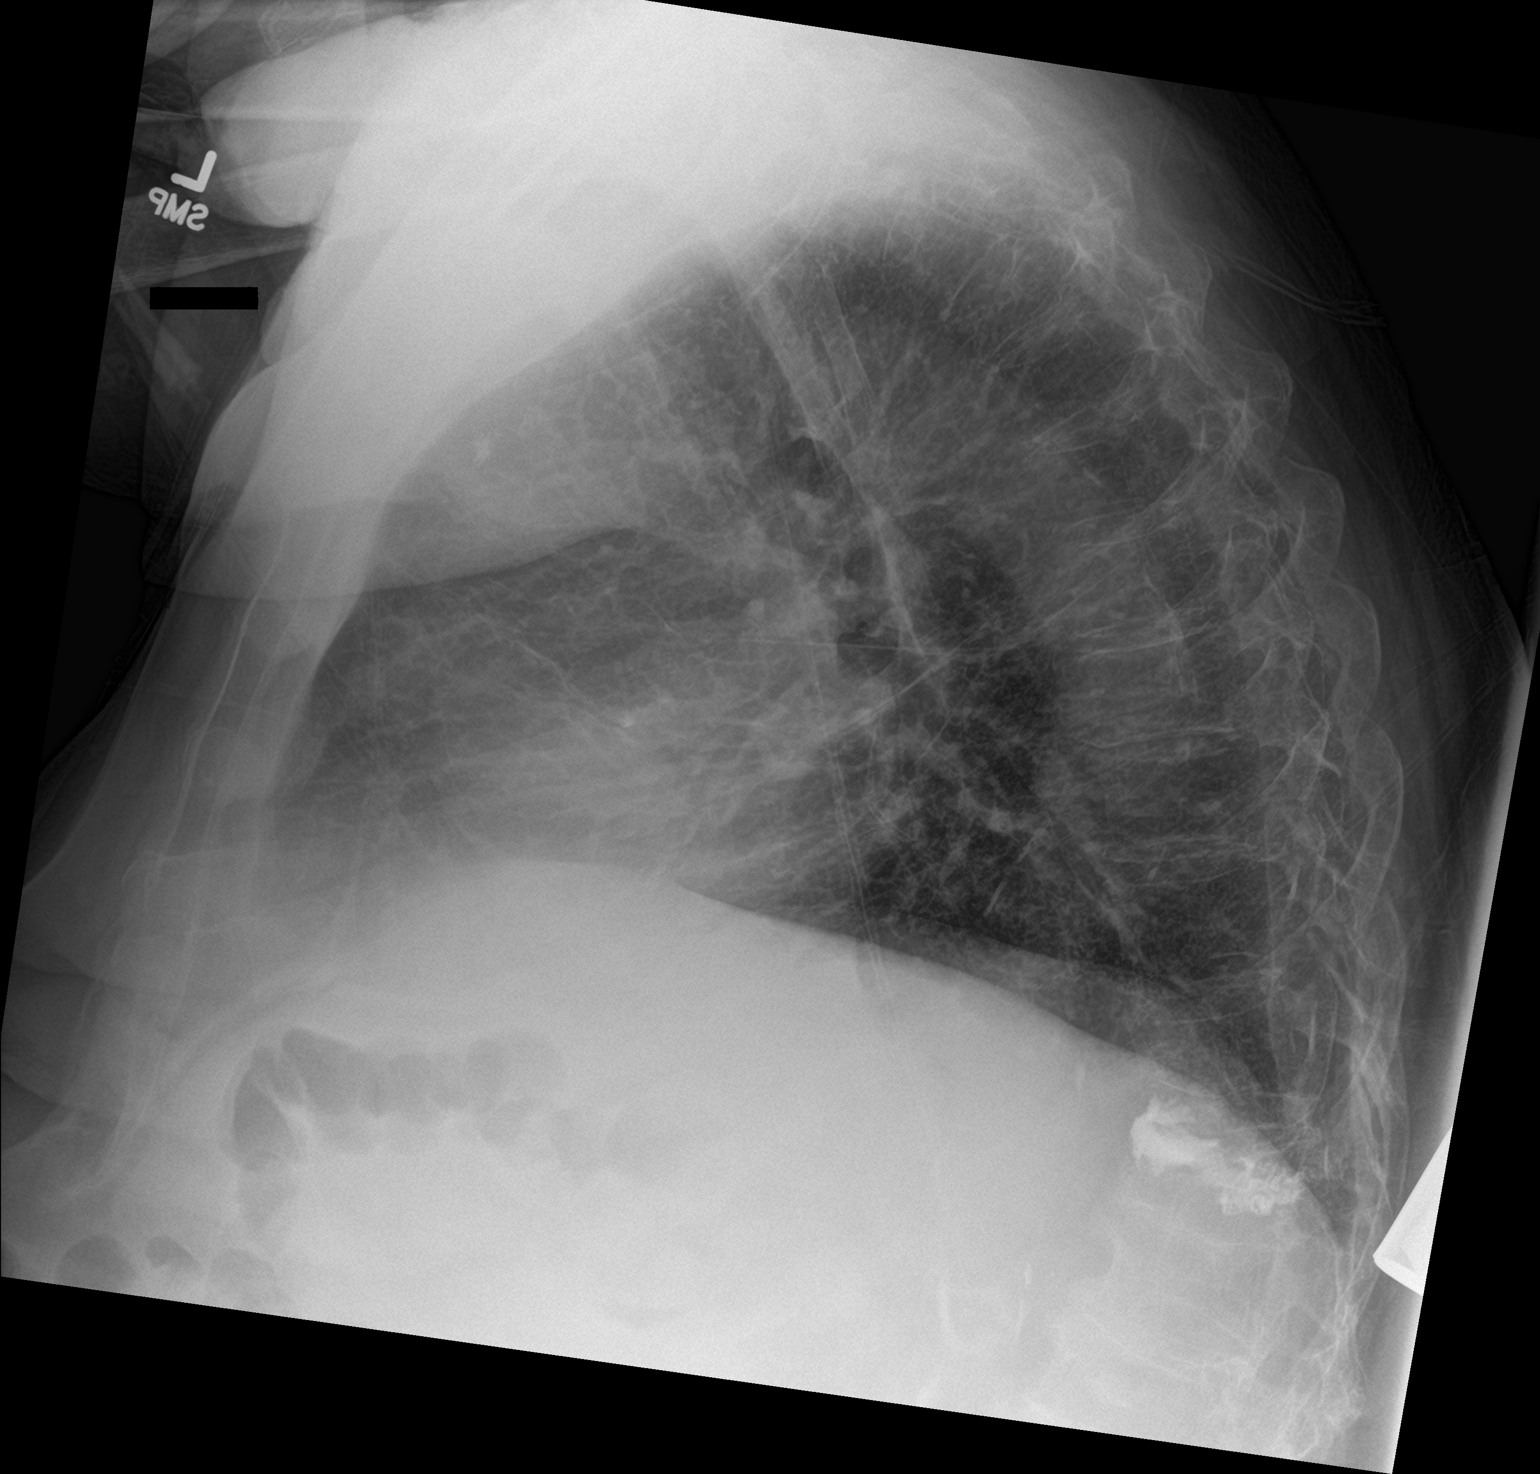

[chest ap]
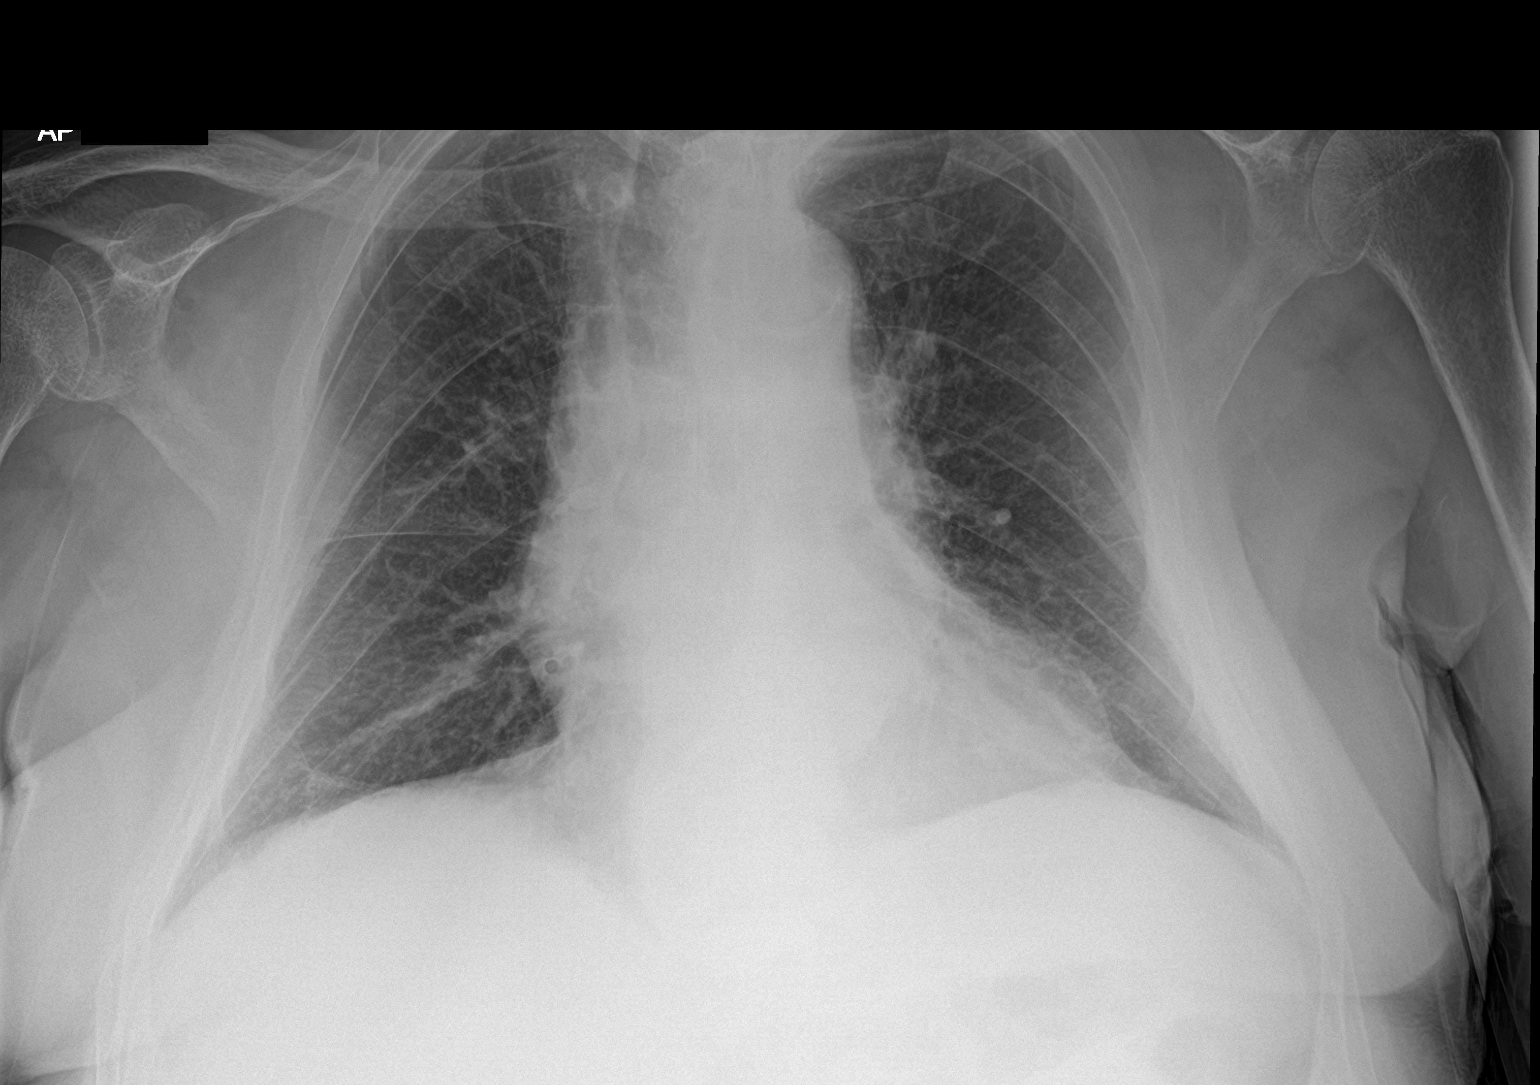

[2 of 2 positions shown; findings below may reference images not displayed]

FINDINGS: Cardiac shadow is mildly enlarged but stable. Aortic calcifications
are seen. Linear scarring is noted in the bases bilaterally. No
focal infiltrate or sizable effusion is seen. Changes of prior
vertebral augmentation are noted.
IMPRESSION: No acute abnormality noted.

## 2021-12-07 IMAGING — CT CT ANGIO CHEST
2 of 6 series · 17 of 46 positions shown · IV contrast (omnipaque)
Comparison: Radiograph earlier today.  Chest CT 01/01/2013

CLINICAL DATA: Chest pain with difficulty breathing, tachycardia,
lower extremity swelling.

EXAM:
CT ANGIOGRAPHY CHEST WITH CONTRAST
TECHNIQUE: Multidetector CT imaging of the chest was performed using the
standard protocol during bolus administration of intravenous
contrast. Multiplanar CT image reconstructions and MIPs were
obtained to evaluate the vascular anatomy.
CONTRAST:  100mL OMNIPAQUE IOHEXOL 350 MG/ML SOLN

[Series 5: pe axial thins · axial · 0.65mm/px · z∈[+162,+384]mm · 14 of 303 slices shown]
[im 13/303  lung]
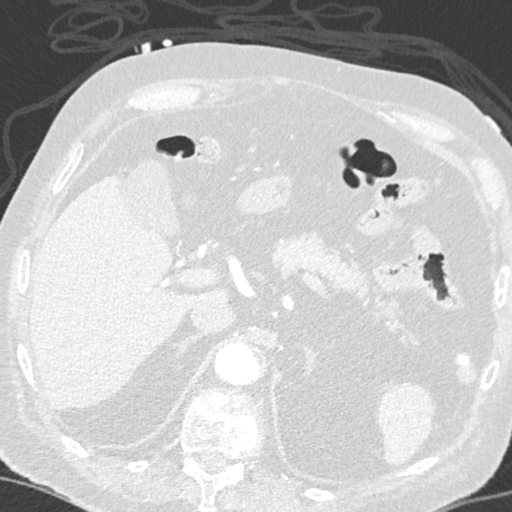
[im 38/303  soft-tissue]
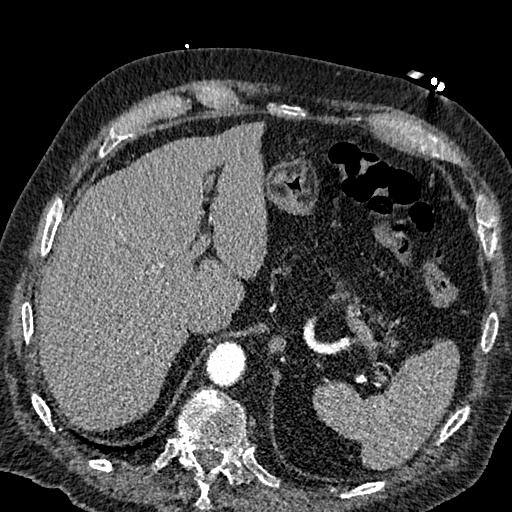
[im 63/303  lung]
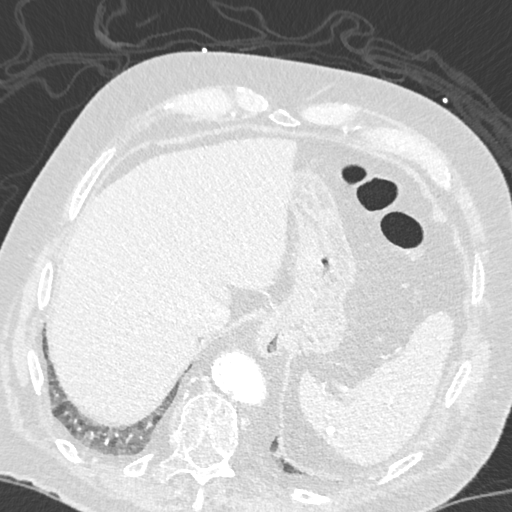
[im 76/303  soft-tissue]
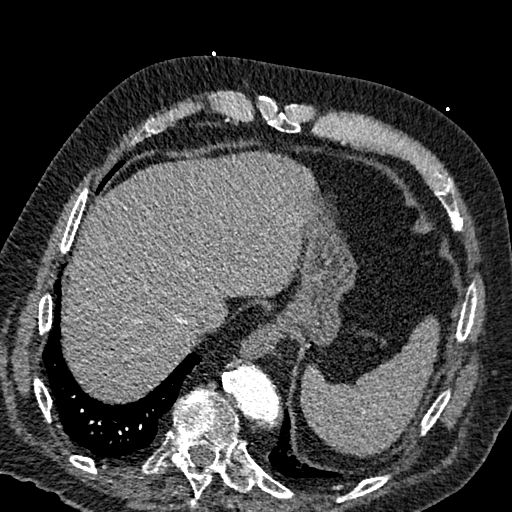
[im 101/303  lung]
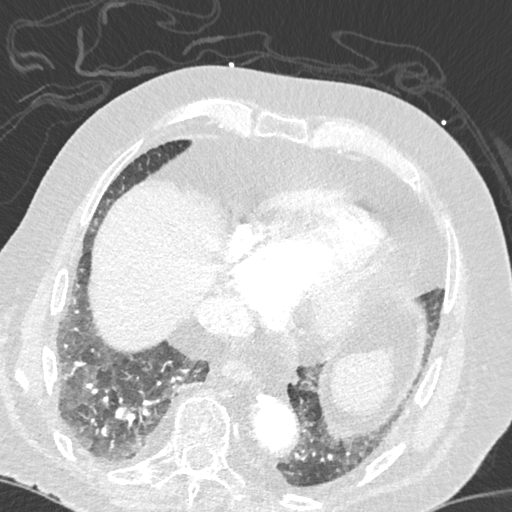
[im 126/303  soft-tissue]
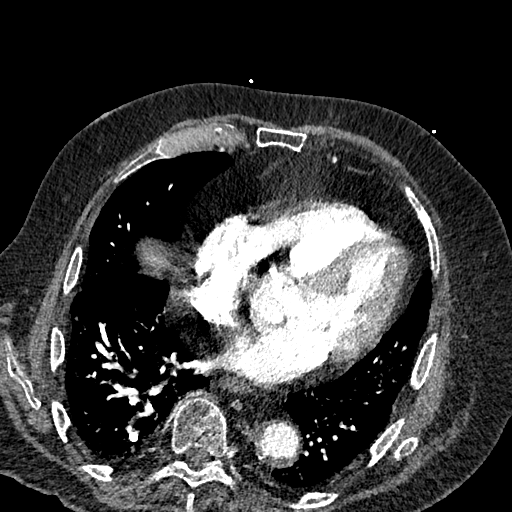
[im 139/303  lung]
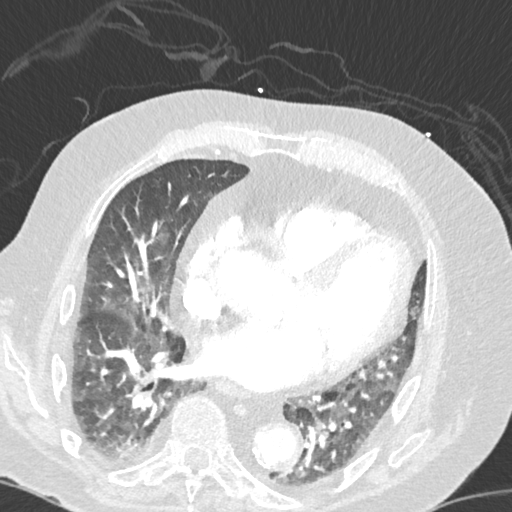
[im 164/303  soft-tissue]
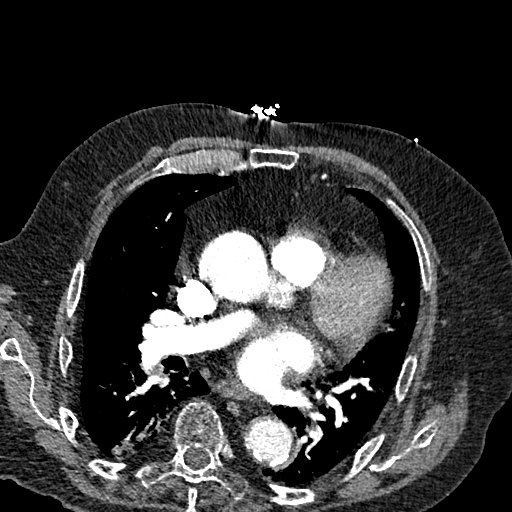
[im 177/303  lung]
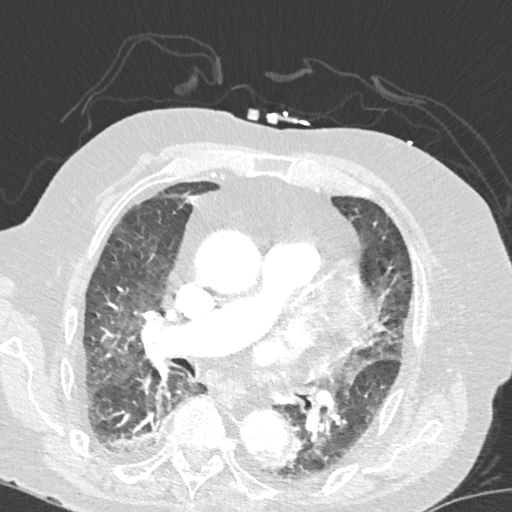
[im 202/303  soft-tissue]
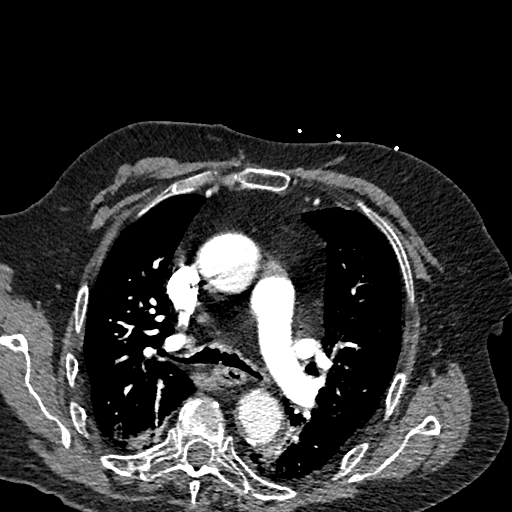
[im 227/303  lung]
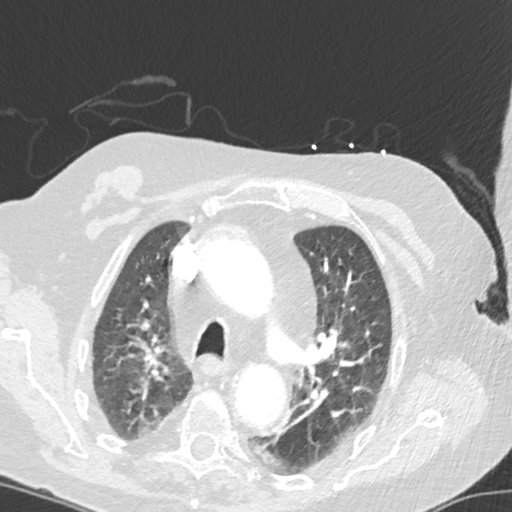
[im 240/303  soft-tissue]
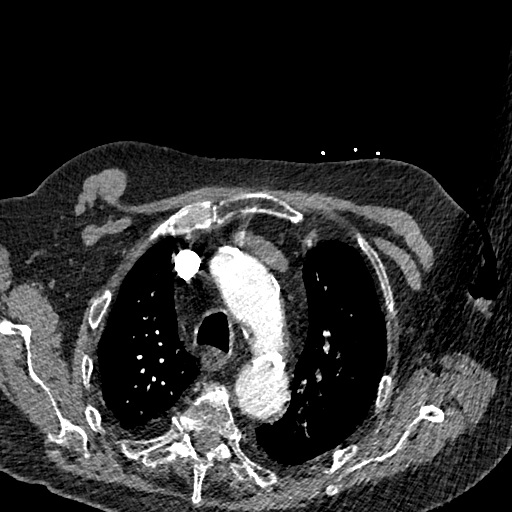
[im 265/303  lung]
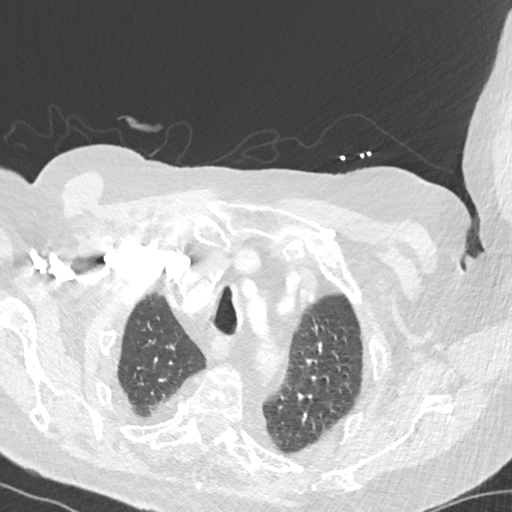
[im 290/303  soft-tissue]
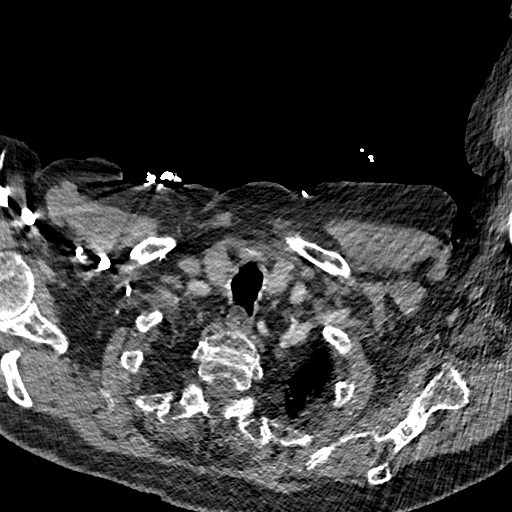

[Series 8: cor soft · coronal · 0.47mm/px · 3 of 139 slices shown]
[im 35/139  soft-tissue]
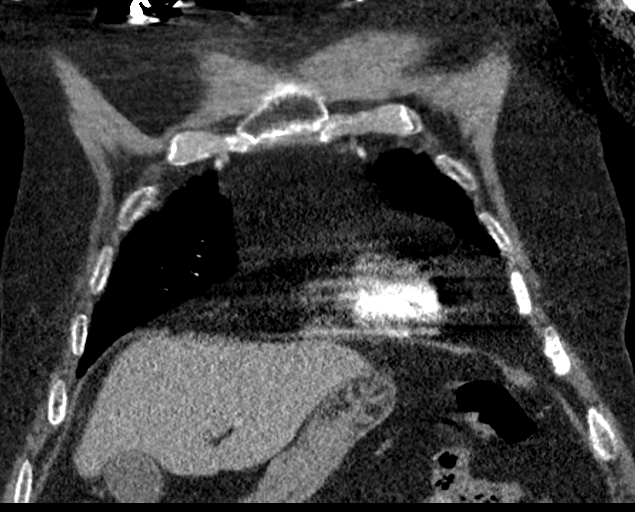
[im 70/139  soft-tissue]
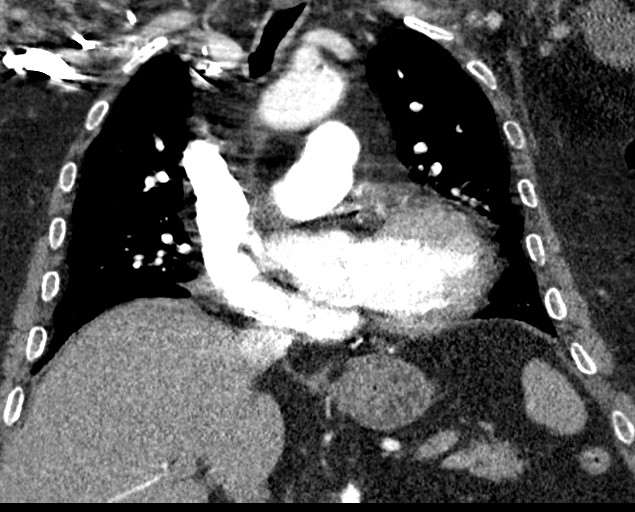
[im 104/139  soft-tissue]
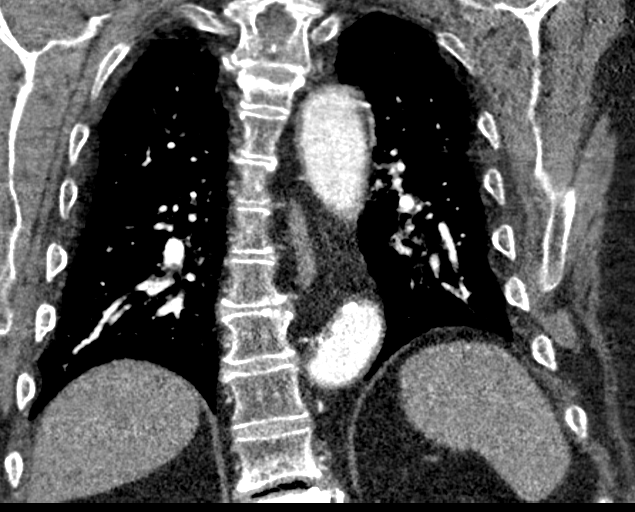

[17 of 46 positions shown; findings below may reference images not displayed]

FINDINGS: Cardiovascular: There are no filling defects within the pulmonary
arteries to suggest pulmonary embolus. Borderline cardiomegaly.
There are coronary artery calcification. Thoracic aorta is tortuous.
Diffuse calcified and noncalcified atheromatous plaque. Plaque in
the descending thoracic aorta is diffusely irregular. No evidence of
acute aortic syndrome or penetrating ulcer. No periaortic stranding.
Pericardial effusion.

Mediastinum/Nodes: Calcified right hilar lymph nodes consistent with
prior granulomatous disease. Stable prominent anterior mediastinal
lymph node measuring 13 mm, series 4, image 33. No additional
enlarged lymph nodes. Tiny hiatal hernia. No suspicious thyroid
nodule.

Lungs/Pleura: Breathing motion artifact limits detailed assessment.
Linear atelectasis or scarring in the anterior right upper lobe.
Heterogeneous pulmonary parenchyma. Dependent atelectasis in the
lower lobes, right greater than left. No confluent airspace disease
or pneumonia. Right lower lobe calcified granuloma. No pleural
fluid. No evidence of pulmonary edema. No pulmonary mass.

Upper Abdomen: Calcified granuloma in the spleen. No acute upper
abdominal findings.

Musculoskeletal: Remote fractures of right anterior lower ribs.
Sclerotic focus within the left anterior fifth rib was not seen on
prior exam. There is a subacute or remote fracture of the left
anterior sixth rib with surrounding callus formation. Stable
appearance of T4 superior endplate compression fracture. The
previous T12 compression fracture is not included in the field of
view, however there may be kyphoplasty material. Mild diffuse
degenerative change in the spine.

Review of the MIP images confirms the above findings.
IMPRESSION: 1. No pulmonary embolus.
2. Heterogeneous pulmonary parenchyma, can be seen with small vessel
or small airways disease.
3. Aortic atherosclerosis, with irregular plaque in the descending
thoracic aorta, similar to prior exam. No acute aortic findings.
4. Subacute or remote left anterior sixth rib fracture with
surrounding callus formation. There is a sclerotic focus within the
adjacent anterior left fifth rib, favor prior fracture given
fracture and adjacent rib. Possibility of sclerotic metastasis is
not entirely excluded in this patient with prostate cancer.
5. Remote right anterior lower rib fractures. Stable T4 superior
endplate compression fracture.

Aortic Atherosclerosis ([id number]).

## 2022-02-22 ENCOUNTER — Other Ambulatory Visit: Payer: Self-pay | Admitting: Cardiology

## 2022-04-17 ENCOUNTER — Other Ambulatory Visit: Payer: Self-pay | Admitting: Cardiology

## 2022-07-14 ENCOUNTER — Encounter: Payer: Self-pay | Admitting: Cardiology

## 2022-07-14 ENCOUNTER — Ambulatory Visit: Payer: Medicare Other | Attending: Cardiology | Admitting: Cardiology

## 2022-07-14 VITALS — BP 118/64 | HR 67 | Ht 70.0 in | Wt 207.0 lb

## 2022-07-14 DIAGNOSIS — I493 Ventricular premature depolarization: Secondary | ICD-10-CM | POA: Diagnosis present

## 2022-07-14 DIAGNOSIS — I25119 Atherosclerotic heart disease of native coronary artery with unspecified angina pectoris: Secondary | ICD-10-CM | POA: Insufficient documentation

## 2022-07-14 NOTE — Patient Instructions (Addendum)

## 2022-07-14 NOTE — Progress Notes (Signed)
Cardiology Office Note  Date: 07/14/2022   ID: Mitchell [name], DOB 03/03/1934, MRN [id number]  PCP:  Eber Hong, MD  Cardiologist:  Rozann Lesches, MD Electrophysiologist:  None   Chief Complaint  Patient presents with   Cardiac follow-up    History of Present Illness: Mitchell [name] is an 88 y.o. male last seen in November 2022.  He is here for a routine follow-up visit.  Reports no angina, no palpitations or unexplained syncope.  Main complaint is of chronic back pain.  He has been doing water therapy, also pending visit with spine specialist to see if there are any other options for treatment.  He has already undergone kyphoplasty.  I reviewed his cardiac regimen which is stable and outlined below.  ECG today shows sinus rhythm with frequent PVCs as before, prolonged PR interval, low voltage and nonspecific ST-T changes.  I reviewed his most recent lab work.  Past Medical History:  Diagnosis Date   Allergic rhinitis    CAD (coronary artery disease)    Moderate, nonobstructive disease   Dupuytren's contracture    Right hand   Essential hypertension    GERD (gastroesophageal reflux disease)    Gout    Herpes zoster    twice   Hyperlipidemia    Idiopathic scoliosis in adult patient    Inguinal hernia    Right sided   Internal hemorrhoids    Lumbosacral spondylosis    Neuropathy    Pre-diabetes    Prostate cancer ([place])    with bone mets- has been treated with chemo    Past Surgical History:  Procedure Laterality Date   EYE SURGERY Bilateral    cataract   HERNIA REPAIR Bilateral    INTRAVASCULAR PRESSURE WIRE/FFR STUDY N/A 12/08/2019   Procedure: INTRAVASCULAR PRESSURE WIRE/FFR STUDY;  Surgeon: Leonie Man, MD;  Location: [facility];  Service: Cardiovascular;  Laterality: N/A;   KYPHOPLASTY N/A 03/14/2021   Procedure: Thoracic Twelve  KYPHOPLASTY;  Surgeon: Erline Levine, MD;  Location: [facility];  Service: Neurosurgery;  Laterality: N/A;  3C/RM 21   LEFT  HEART CATH AND CORONARY ANGIOGRAPHY N/A 12/08/2019   Procedure: LEFT HEART CATH AND CORONARY ANGIOGRAPHY;  Surgeon: Leonie Man, MD;  Location: [facility];  Service: Cardiovascular;  Laterality: N/A;   TYMPANIC MEMBRANE REPAIR     VASECTOMY      Current Outpatient Medications  Medication Sig Dispense Refill   acetaminophen (TYLENOL) 500 MG tablet Take 1,000 mg by mouth every 6 (six) hours as needed.     albuterol (VENTOLIN HFA) 108 (90 Base) MCG/ACT inhaler Inhale 1-2 puffs into the lungs every 6 (six) hours as needed for wheezing or shortness of breath.     allopurinol (ZYLOPRIM) 100 MG tablet Take 200 mg by mouth daily.      amLODipine (NORVASC) 2.5 MG tablet Take 2.5 mg by mouth daily.     aspirin EC 81 MG tablet Take 81 mg by mouth daily.     atorvastatin (LIPITOR) 10 MG tablet Take 1 tablet (10 mg total) by mouth daily. 90 tablet 3   bisoprolol (ZEBETA) 5 MG tablet TAKE 1 TABLET BY MOUTH EVERY DAY 90 tablet 0   colchicine 0.6 MG tablet Take 0.6 mg by mouth 2 (two) times daily as needed (gout).     feeding supplement (ENSURE ENLIVE / ENSURE PLUS) LIQD Take 237 mLs by mouth 2 (two) times daily between meals. 237 mL 12   fluticasone (  FLONASE) 50 MCG/ACT nasal spray Place 2 sprays into both nostrils daily as needed for allergies or rhinitis.     furosemide (LASIX) 20 MG tablet TAKE 1 TABLET BY MOUTH EVERY DAY 90 tablet 1   gabapentin (NEURONTIN) 300 MG capsule Take 600 mg by mouth 3 (three) times daily.      guaiFENesin-dextromethorphan (ROBITUSSIN DM) 100-10 MG/5ML syrup Take 10 mLs by mouth every 6 (six) hours. 118 mL 0   hydrocortisone 2.5 % cream Apply 1 application topically 2 (two) times daily as needed (irritation).     isosorbide mononitrate (IMDUR) 30 MG 24 hr tablet Take 0.5 tablets (15 mg total) by mouth daily. 30 tablet 1   ketoconazole (NIZORAL) 2 % cream Apply topically 2 (two) times daily.     leuprolide, 6 Month, (ELIGARD) 45 MG injection Inject 45 mg into the  skin every 6 (six) months.     meclizine (ANTIVERT) 25 MG tablet Take 25 mg by mouth 3 (three) times daily as needed for dizziness.     Multiple Vitamins-Minerals (MULTIVITAMIN WITH MINERALS) tablet Take 1 tablet by mouth daily.     predniSONE (DELTASONE) 5 MG tablet Take 5 mg by mouth daily with breakfast.     Propylene Glycol (SYSTANE BALANCE) 0.6 % SOLN Place 1 drop into both eyes daily as needed (dry eyes).     traMADol (ULTRAM) 50 MG tablet Take 25 mg by mouth every 8 (eight) hours as needed for moderate pain.     No current facility-administered medications for this visit.   Allergies:  Lisinopril, Losartan, and Penicillins   ROS: No orthopnea or PND.  Physical Exam: VS:  BP 118/64   Pulse 67   Ht '5\' 10"'$  (1.778 m)   Wt 207 lb (93.9 kg)   SpO2 95%   BMI 29.70 kg/m , BMI Body mass index is 29.7 kg/m.  Wt Readings from Last 3 Encounters:  07/14/22 207 lb (93.9 kg)  07/02/21 204 lb (92.5 kg)  05/28/21 210 lb (95.3 kg)    General: Patient appears comfortable at rest. HEENT: Conjunctiva and lids normal. Neck: Supple, no elevated JVP or carotid bruits. Lungs: Clear to auscultation, nonlabored breathing at rest. Cardiac: Regular rate and rhythm, no S3, 1/6 systolic murmur. Extremities: Mild ankle edema.  ECG:  An ECG dated 05/28/2021 was personally reviewed today and demonstrated:  Sinus rhythm with prolonged PR interval, increased voltage, decreased R wave progression, frequent PVCs.  Recent Labwork:  June 2023: Cholesterol 161, triglycerides 180, HDL 44, LDL 81 November 2023: Hemoglobin 14.0, platelets 232, potassium 4.2, BUN 26, creatinine 1.6, AST 22, ALT 17  Other Studies Reviewed Today:  Cardiac catheterization 12/08/2019: The left ventricular systolic function is normal. The left ventricular ejection fraction is 50-55% by visual estimate. LV end diastolic pressure is moderately elevated. There is no aortic valve stenosis. Prox LAD lesion is 55% stenosed with 60%  stenosed side branch in 1st Diag. Both lesions were tested with RFR/DFR and found to be not physiologically significant. Dist LAD lesion is 60% stenosed.   Angiographically moderate-severe single-vessel disease with ostial LAD eccentrically calcified 55-60% stenosis at takeoff of high branching 1st Diag -> RFR/DFR negative for both LAD and Diag; mid LAD focal 60% and hinge point Otherwise minimal CAD in a right dominant system. Normal LVEF and mild-moderately elevated EDP.   Angiographic imaging correlates with noninvasive nuclear stress test imaging. ->  No obvious culprit lesion.  Despite having lesions in the LAD, there was no evidence of anterior ischemia  on the Myoview, this correlates with DFR/FFR results.   Echocardiogram 04/30/2021:  1. Limited study.   2. Left ventricular ejection fraction, by estimation, is 55 to 60%. The  left ventricle has normal function. The left ventricle has no regional  wall motion abnormalities. There is moderate asymmetric left ventricular  hypertrophy of the septal segment.  Left ventricular diastolic function could not be evaluated.   3. Right ventricular systolic function is normal. The right ventricular  size is normal.   4. The mitral valve is abnormal, mildly calcified and with mild annular  calcification.   5. The aortic valve is tricuspid. There is moderate calcification of the  aortic valve. Mild to moderate aortic valve sclerosis/calcification is  present, without any evidence of aortic stenosis.   6. Unable to estimate CVP.   Assessment and Plan:  1.  Frequent PVCs, LVEF normal and no obstructive CAD by previous work-up in 2021.  Tolerating this well on bisoprolol.  Continue with observation.  2.  Moderate nonobstructive CAD with plan to continue medical therapy in the absence of angina.  ECG reviewed.  Continue aspirin and Lipitor.  Last LDL 81.  Medication Adjustments/Labs and Tests Ordered: Current medicines are reviewed at length with  the patient today.  Concerns regarding medicines are outlined above.   Tests Ordered: Orders Placed This Encounter  Procedures   EKG 12-Lead    Medication Changes: No orders of the defined types were placed in this encounter.   Disposition:  Follow up  6 months.  Signed, Satira Sark, MD, [facility] 07/14/2022 4:17 PM    [facility], [place], [place] [address] Phone: [phone]; Fax: [phone]

## 2022-07-16 ENCOUNTER — Other Ambulatory Visit: Payer: Self-pay | Admitting: Cardiology

## 2022-08-10 ENCOUNTER — Other Ambulatory Visit: Payer: Self-pay | Admitting: Cardiology

## 2022-08-13 ENCOUNTER — Other Ambulatory Visit: Payer: Self-pay | Admitting: Cardiology

## 2022-08-16 ENCOUNTER — Other Ambulatory Visit: Payer: Self-pay | Admitting: Cardiology

## 2022-10-01 ENCOUNTER — Encounter: Payer: Self-pay | Admitting: Cardiology

## 2022-10-01 ENCOUNTER — Ambulatory Visit: Payer: Medicare Other | Attending: Cardiology | Admitting: Cardiology

## 2022-10-01 VITALS — BP 112/70 | HR 73 | Ht 70.0 in | Wt 202.0 lb

## 2022-10-01 DIAGNOSIS — I429 Cardiomyopathy, unspecified: Secondary | ICD-10-CM | POA: Insufficient documentation

## 2022-10-01 DIAGNOSIS — I10 Essential (primary) hypertension: Secondary | ICD-10-CM | POA: Diagnosis present

## 2022-10-01 DIAGNOSIS — I25119 Atherosclerotic heart disease of native coronary artery with unspecified angina pectoris: Secondary | ICD-10-CM | POA: Insufficient documentation

## 2022-10-01 DIAGNOSIS — I493 Ventricular premature depolarization: Secondary | ICD-10-CM | POA: Insufficient documentation

## 2022-10-01 NOTE — Progress Notes (Signed)
Cardiology Office Note  Date: 10/01/2022   ID: Mitchell [name], DOB 01-31-1934, MRN [id number]  PCP:  Mitchell Hong, MD  Cardiologist:  Mitchell Lesches, MD Electrophysiologist:  None   Chief Complaint  Patient presents with   Cardiac follow-up    History of Present Illness: Mitchell [name] is an 89 y.o. male last seen in December 2023.  He is here today with his wife. I reviewed available records.  He was hospitalized at [facility] in January with acute hypoxic respiratory failure secondary to pneumonia.  CT imaging was concerning for pulmonary fibrotic changes as well as infiltrates.  He was treated with antibiotics and required oxygen supplementation.  LVEF approximately 45% by follow-up echocardiogram and ascending thoracic aortic aneurysm of 43 mm.  There is mention of "chronic atrial fibrillation" in the discharge summary although I have no ECG for confirmation and he has no prior history of atrial fibrillation, PVCs by our evaluations.  He saw his PCP in follow-up and has pending consultation with Pulmonary in [place].  He is here today reporting no sense of palpitations or chest pain.  Still using supplemental oxygen mostly on an as-needed basis at home, more consistently at nighttime.  They state that with pulse oximetry at home his oxygen saturation is typically greater than 90% even after being off oxygen for several hours.  I reviewed his medications which are stable from a cardiac perspective.  I personally reviewed his ECG today which shows sinus rhythm with left anterior fascicular block, frequent PVCs in couplets.  Past Medical History:  Diagnosis Date   Allergic rhinitis    CAD (coronary artery disease)    Moderate, nonobstructive disease   Dupuytren's contracture    Right hand   Essential hypertension    GERD (gastroesophageal reflux disease)    Gout    Herpes zoster    twice   Hyperlipidemia    Idiopathic scoliosis in adult patient    Inguinal hernia     Right sided   Internal hemorrhoids    Lumbosacral spondylosis    Neuropathy    Pre-diabetes    Prostate cancer ([place])    with bone mets- has been treated with chemo    Past Surgical History:  Procedure Laterality Date   EYE SURGERY Bilateral    cataract   HERNIA REPAIR Bilateral    INTRAVASCULAR PRESSURE WIRE/FFR STUDY N/A 12/08/2019   Procedure: INTRAVASCULAR PRESSURE WIRE/FFR STUDY;  Surgeon: Mitchell Man, MD;  Location: [facility];  Service: Cardiovascular;  Laterality: N/A;   KYPHOPLASTY N/A 03/14/2021   Procedure: Thoracic Twelve  KYPHOPLASTY;  Surgeon: Mitchell Levine, MD;  Location: [facility];  Service: Neurosurgery;  Laterality: N/A;  3C/RM 21   LEFT HEART CATH AND CORONARY ANGIOGRAPHY N/A 12/08/2019   Procedure: LEFT HEART CATH AND CORONARY ANGIOGRAPHY;  Surgeon: Mitchell Man, MD;  Location: [facility];  Service: Cardiovascular;  Laterality: N/A;   TYMPANIC MEMBRANE REPAIR     VASECTOMY      Current Outpatient Medications  Medication Sig Dispense Refill   acetaminophen (TYLENOL) 500 MG tablet Take 1,000 mg by mouth every 6 (six) hours as needed.     albuterol (VENTOLIN HFA) 108 (90 Base) MCG/ACT inhaler Inhale 1-2 puffs into the lungs every 6 (six) hours as needed for wheezing or shortness of breath.     allopurinol (ZYLOPRIM) 100 MG tablet Take 200 mg by mouth daily.      amLODipine (NORVASC) 2.5 MG tablet Take 2.5  mg by mouth daily.     aspirin EC 81 MG tablet Take 81 mg by mouth daily.     atorvastatin (LIPITOR) 10 MG tablet TAKE 1 TABLET BY MOUTH EVERY DAY 90 tablet 3   bisoprolol (ZEBETA) 5 MG tablet TAKE 1 TABLET BY MOUTH EVERY DAY 90 tablet 0   colchicine 0.6 MG tablet Take 0.6 mg by mouth 2 (two) times daily as needed (gout).     fluticasone (FLONASE) 50 MCG/ACT nasal spray Place 2 sprays into both nostrils daily as needed for allergies or rhinitis.     furosemide (LASIX) 20 MG tablet TAKE 1 TABLET BY MOUTH EVERY DAY 90 tablet 1   gabapentin  (NEURONTIN) 300 MG capsule Take 600 mg by mouth 3 (three) times daily.      hydrocortisone 2.5 % cream Apply 1 application topically 2 (two) times daily as needed (irritation).     ketoconazole (NIZORAL) 2 % cream Apply topically as needed for irritation.     leuprolide, 6 Month, (ELIGARD) 45 MG injection Inject 45 mg into the skin every 6 (six) months.     loratadine (CLARITIN) 10 MG tablet Take by mouth as needed for allergies.     meclizine (ANTIVERT) 25 MG tablet Take 25 mg by mouth 3 (three) times daily as needed for dizziness.     Multiple Vitamins-Minerals (MULTIVITAMIN WITH MINERALS) tablet Take 1 tablet by mouth daily.     naproxen sodium (ANAPROX) 550 MG tablet Take by mouth as needed for moderate pain.     predniSONE (DELTASONE) 5 MG tablet Take 5 mg by mouth daily with breakfast.     Propylene Glycol (SYSTANE BALANCE) 0.6 % SOLN Place 1 drop into both eyes daily as needed (dry eyes).     No current facility-administered medications for this visit.   Allergies:  Lisinopril, Losartan, and Penicillins   ROS: No syncope.  Physical Exam: VS:  BP 112/70   Pulse 73   Ht 5' 10"$  (1.778 m)   Wt 202 lb (91.6 kg)   SpO2 95%   BMI 28.98 kg/m , BMI Body mass index is 28.98 kg/m.  Wt Readings from Last 3 Encounters:  10/01/22 202 lb (91.6 kg)  07/14/22 207 lb (93.9 kg)  07/02/21 204 lb (92.5 kg)    General: Patient appears comfortable at rest. HEENT: Conjunctiva and lids normal. Neck: Supple, no elevated JVP or carotid bruits. Lungs: No wheezing or rales, nonlabored. Cardiac: Regular rate and rhythm with ectopy, no gallop. Extremities: No pitting edema.  ECG:  An ECG dated 07/14/2022 was personally reviewed today and demonstrated:  Sinus rhythm with prolonged PR interval and frequent PVCs, LVH.  Recent Labwork:  Echocardiogram [facility]) 08/12/2022: Normal LV wall thickness with LVEF approximately 45% without regional wall motion abnormalities, RV contraction normal, mild  left atrial enlargement, mild mitral regurgitation, no aortic stenosis, aortic root 4 cm, mild to moderate tricuspid regurgitation, no pericardial effusion.  Other Studies Reviewed Today:  December 2023: TSH 2.81, potassium 4.4, BUN 28, creatinine 1.65, AST 17, ALT 16, cholesterol 169, HDL 44, LDL 91, triglycerides 174, hemoglobin 13.8, platelets 216, hemoglobin A1c 6.1%  Assessment and Plan:  1.  Frequent PVCs with history of nonobstructive CAD.  Recent hospitalization noted.  LVEF reported at 45% without regional wall motion abnormalities, although in the setting of hypoxic respiratory failure with pneumonia.  There was mention of atrial fibrillation in the discharge summary as well, although I do not see this confirmed anywhere and in fact he is  in sinus rhythm today with frequent PVCs as before.  For now we will continue with his current medications and I will plan a follow-up echocardiogram and clinical visit in the next 3 to 4 months.  Continue bisoprolol.  2.  Moderate nonobstructive CAD by prior workup, no active angina at this time.  He remains on aspirin and Lipitor.  Medication Adjustments/Labs and Tests Ordered: Current medicines are reviewed at length with the patient today.  Concerns regarding medicines are outlined above.   Tests Ordered: Orders Placed This Encounter  Procedures   EKG 12-Lead   ECHOCARDIOGRAM COMPLETE    Medication Changes: No orders of the defined types were placed in this encounter.   Disposition:  Follow up  3 to 4 months.  Signed, Satira Sark, MD, [name] 10/01/2022 5:08 PM    [facility] [address], [place], [place] [address] Phone: [phone]; Fax: [phone]

## 2022-10-01 NOTE — Patient Instructions (Signed)
Medication Instructions:  Your physician recommends that you continue on your current medications as directed. Please refer to the Current Medication list given to you today.   Labwork: None  Testing/Procedures: Your physician has requested that you have an echocardiogram. Echocardiography is a painless test that uses sound waves to create images of your heart. It provides your doctor with information about the size and shape of your heart and how well your heart's chambers and valves are working. This procedure takes approximately one hour. There are no restrictions for this procedure. Please do NOT wear cologne, perfume, aftershave, or lotions (deodorant is allowed). Please arrive 15 minutes prior to your appointment time.   Follow-Up: Follow up with Dr. Domenic Polite in 3-4 months.   Any Other Special Instructions Will Be Listed Below (If Applicable).     If you need a refill on your cardiac medications before your next appointment, please call your pharmacy.

## 2022-10-27 ENCOUNTER — Other Ambulatory Visit: Payer: Medicare Other

## 2022-10-30 ENCOUNTER — Telehealth: Payer: Self-pay | Admitting: Cardiology

## 2022-10-30 ENCOUNTER — Encounter: Payer: Self-pay | Admitting: *Deleted

## 2022-10-30 NOTE — Telephone Encounter (Signed)
Sent request to [facility] for records

## 2022-10-30 NOTE — Telephone Encounter (Signed)
Caller stated patient's wife reported patient had Echo test when patient was in hospital.  Caller stated she will be faxing over Echo results.  Caller stated patient's wife wants to cancel Echo test scheduled on 5/20 and can call her at [phone] directly for further information.

## 2022-10-30 NOTE — Telephone Encounter (Signed)
See previous phone note for details 

## 2022-10-30 NOTE — Telephone Encounter (Signed)
Mitchell [name] from [place] is calling to verify that fax was received in regards to patient echo. Please advise.

## 2022-10-31 ENCOUNTER — Encounter: Payer: Self-pay | Admitting: Cardiology

## 2022-11-20 ENCOUNTER — Ambulatory Visit: Payer: Medicare Other | Admitting: Cardiology

## 2022-12-04 ENCOUNTER — Ambulatory Visit: Payer: Medicare Other | Admitting: Cardiology

## 2022-12-29 ENCOUNTER — Other Ambulatory Visit: Payer: Medicare Other

## 2023-01-08 ENCOUNTER — Encounter: Payer: Self-pay | Admitting: Student

## 2023-01-08 ENCOUNTER — Ambulatory Visit: Payer: Medicare Other | Attending: Student | Admitting: Student

## 2023-01-08 VITALS — BP 116/78 | HR 66 | Ht 70.0 in | Wt 205.0 lb

## 2023-01-08 DIAGNOSIS — I10 Essential (primary) hypertension: Secondary | ICD-10-CM | POA: Insufficient documentation

## 2023-01-08 DIAGNOSIS — I2510 Atherosclerotic heart disease of native coronary artery without angina pectoris: Secondary | ICD-10-CM | POA: Diagnosis present

## 2023-01-08 DIAGNOSIS — I5032 Chronic diastolic (congestive) heart failure: Secondary | ICD-10-CM | POA: Insufficient documentation

## 2023-01-08 DIAGNOSIS — E785 Hyperlipidemia, unspecified: Secondary | ICD-10-CM | POA: Diagnosis present

## 2023-01-08 NOTE — Patient Instructions (Signed)
Medication Instructions:  Take an extra 20 mg Lasix daily for the next 5 days  Labwork: None today  Testing/Procedures: None today  Follow-Up: 6 months Dr.McDowell in the [place] office  Any Other Special Instructions Will Be Listed Below (If Applicable).  If you need a refill on your cardiac medications before your next appointment, please call your pharmacy.

## 2023-01-08 NOTE — Progress Notes (Signed)
Cardiology Office Note    Date:  01/08/2023  ID:  Mitchell [name], DOB Sep 20, 1933, MRN [id number] Cardiologist: Nona Dell, MD    History of Present Illness:    Mitchell [name] is a 89 y.o. male with past medical history of CAD (moderate disease by catheterization in 2021), HTN, HLD, prediabetes, GERD and history of prostate cancer who presents to the office today for 46-month follow-up.  He was examined by Dr. Diona Browner in 09/2022 and had recently been hospitalized at [facility] for possible pneumonia and CHF with echocardiogram at that time showing an EF of 45%. He denied any recent chest pain or palpitations at the time of his office visit and was in normal sinus rhythm with frequent PVC's on his EKG. His discharge summary had mentioned possible atrial fibrillation but this was not confirmed by tracings or repeat EKG's. He was continued on his current cardiac medications including Amlodipine 2.5 mg daily, ASA 81 mg daily, Bisoprolol 5 mg daily and Atorvastatin 10 mg daily. It was recommended he have a follow-up echocardiogram for reassessment. Follow-up echocardiogram in 10/2022 showed his EF had normalized at 55 to 60%.  In talking with the patient and his wife today, he reports he was hospitalized again in 10/2022 as he suffered a mechanical fall after tripping over his cat. He was in the hospital for at least 5 days and says he was treated again for pneumonia at that time but did not have any significant respiratory issues (records being requested). Was discharged to SNF for rehabilitation but returned home after 2 weeks. He has been working with Home Health PT and OT. Still using a cane for ambulation and a walker at night. Denies any recurrent falls. Says that his breathing has been stable with no specific orthopnea or PND. No recent chest pain or palpitations. He did experience some lower extremity edema while at SNF and was taking 40 mg of Lasix for 5 days and then resumed his baseline dose of  20 mg daily.  Studies Reviewed:   EKG: EKG is not ordered today.   LHC: 11/2019 The left ventricular systolic function is normal. The left ventricular ejection fraction is 50-55% by visual estimate. LV end diastolic pressure is moderately elevated. There is no aortic valve stenosis. Prox LAD lesion is 55% stenosed with 60% stenosed side branch in 1st Diag. Both lesions were tested with RFR/DFR and found to be not physiologically significant. Dist LAD lesion is 60% stenosed.   Angiographically moderate-severe single-vessel disease with ostial LAD eccentrically calcified 55-60% stenosis at takeoff of high branching 1st Diag -> RFR/DFR negative for both LAD and Diag; mid LAD focal 60% and hinge point Otherwise minimal CAD in a right dominant system. Normal LVEF and mild-moderately elevated EDP.   Angiographic imaging correlates with noninvasive nuclear stress test imaging. ->  No obvious culprit lesion.  Despite having lesions in the LAD, there was no evidence of anterior ischemia on the Myoview, this correlates with DFR/FFR results.  Echocardiogram: 10/2022     Physical Exam:   VS:  BP 116/78   Pulse 66   Ht 5\' 10"  (1.778 m)   Wt 205 lb (93 kg)   SpO2 94%   BMI 29.41 kg/m    Wt Readings from Last 3 Encounters:  01/08/23 205 lb (93 kg)  10/01/22 202 lb (91.6 kg)  07/14/22 207 lb (93.9 kg)     GEN: Pleasant, elderly male appearing in no acute distress NECK: No JVD; No carotid bruits  CARDIAC: RRR, no murmurs, rubs, gallops RESPIRATORY:  Clear to auscultation without rales, wheezing or rhonchi  ABDOMEN: Appears non-distended. No obvious abdominal masses. EXTREMITIES: No clubbing or cyanosis. 1+ pitting edema up to mid-shins bilaterally.  Distal pedal pulses are 2+ bilaterally.   Assessment and Plan:   1. HFimpEF - His ejection fraction was previously at 45% in 08/2022 but had normalized to 55 to 60% by repeat imaging in 10/2022. His lungs are clear on examination today  but he does have 1+ pitting edema up to his mid shins. Suspect this is secondary to dependent edema given his reduced activity. He is currently taking Lasix 20 mg daily and I did recommend that he take 40 mg daily for the next 5 days and then resume his baseline dosing. I encouraged him to make [place] aware if he has recurrent edema as we may need to titrate his baseline dose to 20 mg daily alternating with 40 mg daily and he would need a follow-up BMET in 7-10 days. He also eats at [employer] routinely and we reviewed the importance of limiting his sodium intake.  2. CAD - He had moderate disease by catheterization in 2021  He denies any recent anginal symptoms and recent echocardiogram as discussed above showed that his EF has normalized. Continue current medical therapy with ASA 81 mg daily, Atorvastatin 10 mg daily and Bisoprolol 5 mg daily.  3. HTN - His blood pressure is well-controlled at 116/78 during today's visit. Continue current medical therapy with Amlodipine 2.5 mg daily and Bisoprolol 5 mg daily.  4. HLD - Followed by his PCP. LDL was at 90 in 07/2022. Remains on Atorvastatin 10 mg daily as he was previously intolerant to higher intensity statin therapy. If LDL remains above goal, would consider rechallenging with Atorvastatin 20 mg daily or switching to Crestor 10 mg daily.  Signed, Ellsworth Lennox, PA-C

## 2023-01-18 ENCOUNTER — Other Ambulatory Visit: Payer: Self-pay | Admitting: Cardiology

## 2023-01-20 ENCOUNTER — Telehealth: Payer: Self-pay | Admitting: Student

## 2023-01-20 DIAGNOSIS — I5020 Unspecified systolic (congestive) heart failure: Secondary | ICD-10-CM

## 2023-01-20 DIAGNOSIS — I5032 Chronic diastolic (congestive) heart failure: Secondary | ICD-10-CM

## 2023-01-20 DIAGNOSIS — M7989 Other specified soft tissue disorders: Secondary | ICD-10-CM

## 2023-01-20 NOTE — Telephone Encounter (Signed)
Pt c/o medication issue:  1. Name of Medication:   furosemide (LASIX) 20 MG tablet    2. How are you currently taking this medication (dosage and times per day)? As written   3. Are you having a reaction (difficulty breathing--STAT)? No   4. What is your medication issue?  Pt called in stating following some medication changes to this medication, his legs are still swelling. Please advise.

## 2023-01-21 MED ORDER — FUROSEMIDE 20 MG PO TABS: ORAL_TABLET | ORAL | 11 refills | Status: DC

## 2023-01-21 NOTE — Telephone Encounter (Signed)
    Please have him increase his standing dose of Lasix to 20 mg daily alternating with 40 mg daily. Repeat BNP and BMET in 7-10 days. May ultimately need to titrate to 40mg  daily but want to be cautious given his kidney function and to also recheck potassium levels. Would encourage him to elevate his lower extremities as much as possible and continue to try to limit sodium intake as he was eating at local restaurants frequently at the time of his office visit.  Signed, Ellsworth Lennox, PA-C 01/21/2023, 10:14 AM Pager: [phone]

## 2023-01-21 NOTE — Telephone Encounter (Signed)
Pt notified of medication changes and the need for labs.

## 2023-01-21 NOTE — Telephone Encounter (Signed)
Returned call to pt, no answer. Left msg to call back.  ?

## 2023-01-21 NOTE — Telephone Encounter (Signed)
Spoke with pt who states that his legs and ankles are still swollen after the 5 day increase of Lasix. Pt reports that the swelling is better in the morning and increases throughout the day. Current weight on today is 199.8 lbs and 199.0 on yesterday. Please advise.

## 2023-04-18 ENCOUNTER — Other Ambulatory Visit: Payer: Self-pay | Admitting: Cardiology

## 2023-06-11 ENCOUNTER — Ambulatory Visit: Payer: Medicare Other

## 2023-06-18 ENCOUNTER — Other Ambulatory Visit: Payer: Medicare Other

## 2023-06-23 ENCOUNTER — Ambulatory Visit: Payer: Medicare Other | Attending: Cardiology

## 2023-06-23 DIAGNOSIS — I25119 Atherosclerotic heart disease of native coronary artery with unspecified angina pectoris: Secondary | ICD-10-CM | POA: Diagnosis not present

## 2023-06-23 DIAGNOSIS — I429 Cardiomyopathy, unspecified: Secondary | ICD-10-CM | POA: Insufficient documentation

## 2023-06-23 MED ORDER — PERFLUTREN LIPID MICROSPHERE
1.0000 mL | INTRAVENOUS | Status: AC | PRN
  Administered 2023-06-23: 4 mL via INTRAVENOUS

## 2023-06-24 LAB — ECHOCARDIOGRAM COMPLETE
AR max vel: 2.11 cm2
AV Area VTI: 1.92 cm2
AV Area mean vel: 2.09 cm2
AV Mean grad: 4 mm[Hg]
AV Peak grad: 6.4 mm[Hg]
Ao pk vel: 1.26 m/s
Area-P 1/2: 2.66 cm2
Est EF: 55
MV VTI: 2.1 cm2
S' Lateral: 3.9 cm

## 2023-07-29 ENCOUNTER — Ambulatory Visit: Payer: Medicare Other | Admitting: Cardiology

## 2023-08-02 ENCOUNTER — Other Ambulatory Visit: Payer: Self-pay | Admitting: Cardiology

## 2023-08-26 ENCOUNTER — Encounter: Payer: Self-pay | Admitting: Gastroenterology

## 2023-08-26 NOTE — Progress Notes (Deleted)
 Referring Provider:Eason, Renae Fickle, MD Primary Care Physician:  Kathlee Nations, MD Primary Gastroenterologist:  Dr. Bonnetta Barry chief complaint on file.   HPI:   Mitchell [name] is a 89 y.o. male presenting today at the request of Kathlee Nations, MD for abdominal pain and diarrhea.  Reviewed office visit with Dr. Maryellen Pile 08/07/2023.  Patient reported feeling pretty well overall, but had some unusual things told it was like it had pus in it.  Also with some abdominal pain that was not severe.  Plan to check stool studies.  O&P was negative.  Fecal white blood cells also negative.  Recommended referring to GI if patient was still having problems.   Today:    Past Medical History:  Diagnosis Date   Allergic rhinitis    CAD (coronary artery disease)    Moderate, nonobstructive disease   Dupuytren's contracture    Right hand   Essential hypertension    GERD (gastroesophageal reflux disease)    Gout    Herpes zoster    twice   Hyperlipidemia    Idiopathic scoliosis in adult patient    Inguinal hernia    Right sided   Internal hemorrhoids    Lumbosacral spondylosis    Neuropathy    Pre-diabetes    Prostate cancer (HCC)    with bone mets- has been treated with chemo    Past Surgical History:  Procedure Laterality Date   CORONARY PRESSURE/FFR STUDY N/A 12/08/2019   Procedure: INTRAVASCULAR PRESSURE WIRE/FFR STUDY;  Surgeon: Marykay Lex, MD;  [facility] IN[facility] LAB;  Service: Cardiovascular;  Laterality: N/A;   EYE SURGERY Bilateral    cataract   HERNIA REPAIR Bilateral    KYPHOPLASTY N/A 03/14/2021   Procedure: Thoracic Twelve  KYPHOPLASTY;  Surgeon: Maeola Harman, MD;  Location: [facility];  Service: Neurosurgery;  Laterality: N/A;  3C/RM 21   LEFT HEART CATH AND CORONARY ANGIOGRAPHY N/A 12/08/2019   Procedure: LEFT HEART CATH AND CORONARY ANGIOGRAPHY;  Surgeon: Marykay Lex, MD;  Location: [facility] INVASIVE CV LAB;  Service: Cardiovascular;  Laterality: N/A;   TYMPANIC MEMBRANE  REPAIR     VASECTOMY      Current Outpatient Medications  Medication Sig Dispense Refill   acetaminophen (TYLENOL) 500 MG tablet Take 1,000 mg by mouth every 6 (six) hours as needed.     albuterol (VENTOLIN HFA) 108 (90 Base) MCG/ACT inhaler Inhale 1-2 puffs into the lungs every 6 (six) hours as needed for wheezing or shortness of breath.     allopurinol (ZYLOPRIM) 100 MG tablet Take 200 mg by mouth daily.      amLODipine (NORVASC) 2.5 MG tablet Take 2.5 mg by mouth daily.     aspirin EC 81 MG tablet Take 81 mg by mouth daily.     atorvastatin (LIPITOR) 10 MG tablet TAKE 1 TABLET BY MOUTH EVERY DAY 90 tablet 3   bisoprolol (ZEBETA) 5 MG tablet TAKE 1 TABLET BY MOUTH EVERY DAY 90 tablet 2   colchicine 0.6 MG tablet Take 0.6 mg by mouth 2 (two) times daily as needed (gout).     fluticasone (FLONASE) 50 MCG/ACT nasal spray Place 2 sprays into both nostrils daily as needed for allergies or rhinitis.     furosemide (LASIX) 20 MG tablet Take 20 mg ( 1 tablet) alternating with 40 mg ( 2 tablets) daily 45 tablet 11   gabapentin (NEURONTIN) 300 MG capsule Take 600 mg by mouth 3 (three) times daily.  hydrocortisone 2.5 % cream Apply 1 application topically 2 (two) times daily as needed (irritation).     ketoconazole (NIZORAL) 2 % cream Apply topically as needed for irritation.     leuprolide, 6 Month, (ELIGARD) 45 MG injection Inject 45 mg into the skin every 6 (six) months.     loratadine (CLARITIN) 10 MG tablet Take by mouth as needed for allergies.     meclizine (ANTIVERT) 25 MG tablet Take 25 mg by mouth 3 (three) times daily as needed for dizziness.     Multiple Vitamins-Minerals (MULTIVITAMIN WITH MINERALS) tablet Take 1 tablet by mouth daily.     naproxen sodium (ANAPROX) 550 MG tablet Take by mouth as needed for moderate pain.     predniSONE (DELTASONE) 5 MG tablet Take 5 mg by mouth daily with breakfast.     Propylene Glycol (SYSTANE BALANCE) 0.6 % SOLN Place 1 drop into both eyes daily  as needed (dry eyes).     No current facility-administered medications for this visit.    Allergies as of 08/27/2023 - Review Complete 01/08/2023  Allergen Reaction Noted   Lisinopril Cough 11/01/2019   Losartan Cough 11/01/2019   Penicillins Rash 11/01/2019    Family History  Problem Relation Age of Onset   Heart failure Mother    Heart failure Father    CAD Father    Cancer Sister    CAD Brother    Stroke Brother    Neuropathy Neg Hx     Social History   Socioeconomic History   Marital status: Married    Spouse name: Not on file   Number of children: 3   Years of education: 14   Highest education level: Not on file  Occupational History   Not on file  Tobacco Use   Smoking status: Never   Smokeless tobacco: Never  Vaping Use   Vaping status: Never Used  Substance and Sexual Activity   Alcohol use: Not Currently    Alcohol/week: 0.0 - 1.0 standard drinks of alcohol    Comment: "at best"   Drug use: Never   Sexual activity: Not on file  Other Topics Concern   Not on file  Social History Narrative   Lives with wife at home    Right handed   Caffeine: 1 cup/day   Social Drivers of Corporate investment banker Strain: Low Risk  (11/14/2022)   Received from [facility], [facility]   Overall Financial Resource Strain (CARDIA)    Difficulty of Paying Living Expenses: Not hard at all  Food Insecurity: No Food Insecurity (11/14/2022)   Received from [facility], [facility]   Hunger Vital Sign    Worried About Running Out of Food in the Last Year: Never true    Ran Out of Food in the Last Year: Never true  Transportation Needs: No Transportation Needs (11/14/2022)   Received from [facility], [facility]   [facility] - Transportation    Lack of Transportation (Medical): No    Lack of Transportation (Non-Medical): No  Physical Activity: Inactive (11/14/2022)   Received from [facility], [facility]   Exercise Vital Sign    Days of  Exercise per Week: 0 days    Minutes of Exercise per Session: 0 min  Stress: No Stress Concern Present (11/14/2022)   Received from [facility], [facility] of Occupational Health - Occupational Stress Questionnaire    Feeling of Stress : Not at all  Social Connections: Moderately  Integrated (11/14/2022)   Received from [facility], [facility]   Social Connection and Isolation Panel [NHANES]    Frequency of Communication with Friends and Family: More than three times a week    Frequency of Social Gatherings with Friends and Family: Once a week    Attends Religious Services: More than 4 times per year    Active Member of [employer] or Organizations: No    Attends Banker Meetings: Never    Marital Status: Married  Catering manager Violence: Not At Risk (11/14/2022)   Received from [facility], [employer], Afraid, Rape, and Kick questionnaire    Fear of Current or Ex-Partner: No    Emotionally Abused: No    Physically Abused: No    Sexually Abused: No    Review of Systems: Gen: Denies any fever, chills, fatigue, weight loss, lack of appetite.  CV: Denies chest pain, heart palpitations, peripheral edema, syncope.  Resp: Denies shortness of breath at rest or with exertion. Denies wheezing or cough.  GI: Denies dysphagia or odynophagia. Denies jaundice, hematemesis, fecal incontinence. GU : Denies urinary burning, urinary frequency, urinary hesitancy MS: Denies joint pain, muscle weakness, cramps, or limitation of movement.  Derm: Denies rash, itching, dry skin Psych: Denies depression, anxiety, memory loss, and confusion Heme: Denies bruising, bleeding, and enlarged lymph nodes.  Physical Exam: There were no vitals taken for this visit. General:   Alert and oriented. Pleasant and cooperative. Well-nourished and well-developed.  Head:  Normocephalic and atraumatic. Eyes:  Without icterus, sclera clear and conjunctiva  pink.  Ears:  Normal auditory acuity. Lungs:  Clear to auscultation bilaterally. No wheezes, rales, or rhonchi. No distress.  Heart:  S1, S2 present without murmurs appreciated.  Abdomen:  +BS, soft, non-tender and non-distended. No HSM noted. No guarding or rebound. No masses appreciated.  Rectal:  Deferred  Msk:  Symmetrical without gross deformities. Normal posture. Extremities:  Without edema. Neurologic:  Alert and  oriented x4;  grossly normal neurologically. Skin:  Intact without significant lesions or rashes. Psych:  Alert and cooperative. Normal mood and affect.    Assessment:     Plan:  ***   Ermalinda Memos, PA-C [facility] Gastroenterology 08/27/2023

## 2023-08-27 ENCOUNTER — Ambulatory Visit: Payer: Medicare Other | Admitting: Gastroenterology

## 2023-08-27 ENCOUNTER — Encounter: Payer: Self-pay | Admitting: Gastroenterology

## 2023-08-27 VITALS — BP 139/81 | HR 72 | Temp 97.5°F | Ht 67.0 in | Wt 197.2 lb

## 2023-08-27 DIAGNOSIS — R197 Diarrhea, unspecified: Secondary | ICD-10-CM

## 2023-08-27 DIAGNOSIS — K5900 Constipation, unspecified: Secondary | ICD-10-CM

## 2023-08-27 DIAGNOSIS — R1030 Lower abdominal pain, unspecified: Secondary | ICD-10-CM | POA: Diagnosis not present

## 2023-08-27 DIAGNOSIS — R198 Other specified symptoms and signs involving the digestive system and abdomen: Secondary | ICD-10-CM

## 2023-08-27 NOTE — Patient Instructions (Signed)
As we discussed, I suspect that you are dealing with baseline constipation with overflow diarrhea.  I recommended that you start MiraLAX 1 capful (17 g) daily in 8 ounces of water or other noncarbonated beverage of your choice.  You may have some diarrhea initially with MiraLAX, but continue taking this as  I suspect that your bowel habits will normalize.  I will plan to see you back in the office in 8 weeks or sooner if needed.  It was nice to meet you today!   Ermalinda Memos, PA-C [facility] Gastroenterology

## 2023-08-27 NOTE — Progress Notes (Signed)
Referring Provider:  Kathlee Nations, MD Primary Care Physician:  Kathlee Nations, MD Primary Gastroenterologist:  Dr. Jena Gauss  Chief Complaint  Patient presents with   Diarrhea    Having abdominal pain and diarrhea for several months.     HPI:   Mitchell [name] is a 89 y.o. male  presenting today at the request of Kathlee Nations, MD for abdominal pain and diarrhea.   Reviewed office visit with Dr. Maryellen Pile 08/07/2023.  Patient reported feeling pretty well overall, but had some unusual things stool it was like it had pus in it.  Also with some abdominal pain that was not severe.  Plan to check stool studies.   O&P was negative.  Fecal white blood cells also negative.  Recommended referring to GI if patient was still having problems.   Prior labs 07/31/23: WBC 12.8, Hgb 14.0. Cr 1.48, AST 26, ALT 19, alk phos 99, t bili 1.6, A1C 6.3.    Today: Will go 1 week or more without a BM. Once bowels move, he will have a bout of diarrhea. Will have about 3 Bms, the go several days without a BM. Starts out loose and may end with a watery bowel movement. Doesn't usually take anything to help with his bowels. The other day he did take some ducolax. Has never been one to have bowel movements daily.   Some intermittent mild lower abdominal cramping like he needs to have a BM that resolves once his bowels move.   No brbpr, melena, unintentional weight loss, nausea, or vomiting.    Planning to see [facility] in April for possible back procedure.   Past Medical History:  Diagnosis Date   Allergic rhinitis    CAD (coronary artery disease)    Moderate, nonobstructive disease   CKD (chronic kidney disease)    Dupuytren's contracture    Right hand   Essential hypertension    GERD (gastroesophageal reflux disease)    Gout    Heart failure (HCC)    Herpes zoster    twice   Hyperlipidemia    Idiopathic scoliosis in adult patient    Inguinal hernia    Right sided   Internal hemorrhoids    Lumbosacral spondylosis     Neuropathy    Pre-diabetes    Prostate cancer (HCC)    with bone mets- has been treated with chemo    Past Surgical History:  Procedure Laterality Date   CORONARY PRESSURE/FFR STUDY N/A 12/08/2019   Procedure: INTRAVASCULAR PRESSURE WIRE/FFR STUDY;  Surgeon: Marykay Lex, MD;  Location: [facility] INVASIVE CV LAB;  Service: Cardiovascular;  Laterality: N/A;   EYE SURGERY Bilateral    cataract   HERNIA REPAIR Bilateral    KYPHOPLASTY N/A 03/14/2021   Procedure: Thoracic Twelve  KYPHOPLASTY;  Surgeon: Maeola Harman, MD;  Location: [facility] OR;  Service: Neurosurgery;  Laterality: N/A;  3C/RM 21   LEFT HEART CATH AND CORONARY ANGIOGRAPHY N/A 12/08/2019   Procedure: LEFT HEART CATH AND CORONARY ANGIOGRAPHY;  Surgeon: Marykay Lex, MD;  Location: [facility] INVASIVE CV LAB;  Service: Cardiovascular;  Laterality: N/A;   TYMPANIC MEMBRANE REPAIR     VASECTOMY      Current Outpatient Medications  Medication Sig Dispense Refill   acetaminophen (TYLENOL) 500 MG tablet Take 1,000 mg by mouth every 6 (six) hours as needed.     allopurinol (ZYLOPRIM) 100 MG tablet Take 200 mg by mouth daily.      amLODipine (NORVASC) 2.5 MG tablet Take 2.5 mg by  mouth daily.     aspirin EC 81 MG tablet Take 81 mg by mouth daily.     atorvastatin (LIPITOR) 10 MG tablet TAKE 1 TABLET BY MOUTH EVERY DAY 90 tablet 3   bisoprolol (ZEBETA) 5 MG tablet TAKE 1 TABLET BY MOUTH EVERY DAY 90 tablet 2   colchicine 0.6 MG tablet Take 0.6 mg by mouth 2 (two) times daily as needed (gout).     fluticasone (FLONASE) 50 MCG/ACT nasal spray Place 2 sprays into both nostrils daily as needed for allergies or rhinitis.     furosemide (LASIX) 20 MG tablet Take 20 mg ( 1 tablet) alternating with 40 mg ( 2 tablets) daily 45 tablet 11   gabapentin (NEURONTIN) 300 MG capsule Take 600 mg by mouth 3 (three) times daily.      loratadine (CLARITIN) 10 MG tablet Take by mouth as needed for allergies.     meclizine (ANTIVERT) 25 MG tablet Take 25 mg by mouth 3  (three) times daily as needed for dizziness.     Multiple Vitamins-Minerals (MULTIVITAMIN WITH MINERALS) tablet Take 1 tablet by mouth daily.     predniSONE (DELTASONE) 5 MG tablet Take 5 mg by mouth daily with breakfast.     Propylene Glycol (SYSTANE BALANCE) 0.6 % SOLN Place 1 drop into both eyes daily as needed (dry eyes).     No current facility-administered medications for this visit.    Allergies as of 08/27/2023 - Review Complete 08/27/2023  Allergen Reaction Noted   Lisinopril Cough 11/01/2019   Losartan Cough 11/01/2019   Penicillins Rash 11/01/2019    Family History  Problem Relation Age of Onset   Heart failure Mother    Heart failure Father    CAD Father    Cancer Sister    CAD Brother    Stroke Brother    Neuropathy Neg Hx     Social History   Socioeconomic History   Marital status: Married    Spouse name: Not on file   Number of children: 3   Years of education: 14   Highest education level: Not on file  Occupational History   Not on file  Tobacco Use   Smoking status: Never   Smokeless tobacco: Never  Vaping Use   Vaping status: Never Used  Substance and Sexual Activity   Alcohol use: Not Currently    Alcohol/week: 0.0 - 1.0 standard drinks of alcohol    Comment: "at best"   Drug use: Never   Sexual activity: Not on file  Other Topics Concern   Not on file  Social History Narrative   Lives with wife at home    Right handed   Caffeine: 1 cup/day   Social Drivers of Corporate investment banker Strain: Low Risk  (11/14/2022)   Received from [facility], [facility]   Overall Financial Resource Strain (CARDIA)    Difficulty of Paying Living Expenses: Not hard at all  Food Insecurity: No Food Insecurity (11/14/2022)   Received from [facility], [facility]   Hunger Vital Sign    Worried About Running Out of Food in the Last Year: Never true    Ran Out of Food in the Last Year: Never true  Transportation Needs: No Transportation  Needs (11/14/2022)   Received from [facility], [facility]   [facility] - Transportation    Lack of Transportation (Medical): No    Lack of Transportation (Non-Medical): No  Physical Activity: Inactive (11/14/2022)   Received from [facility], [facility]   Exercise Vital Sign    Days of Exercise per Week: 0 days    Minutes of Exercise per Session: 0 min  Stress: No Stress Concern Present (11/14/2022)   Received from [facility], [facility] of Occupational Health - Occupational Stress Questionnaire    Feeling of Stress : Not at all  Social Connections: Moderately Integrated (11/14/2022)   Received from [facility], [facility]   Social Connection and Isolation Panel [NHANES]    Frequency of Communication with Friends and Family: More than three times a week    Frequency of Social Gatherings with Friends and Family: Once a week    Attends Religious Services: More than 4 times per year    Active Member of [employer] or Organizations: No    Attends Banker Meetings: Never    Marital Status: Married  Catering manager Violence: Not At Risk (11/14/2022)   Received from [facility], [employer], Afraid, Rape, and Kick questionnaire    Fear of Current or Ex-Partner: No    Emotionally Abused: No    Physically Abused: No    Sexually Abused: No    Review of Systems: Gen: Denies any fever, chills, cold or flulike symptoms, presyncope, syncope. GI: See HPI Heme: See HPI  Physical Exam: BP 139/81 (BP Location: Left Arm, Patient Position: Sitting, Cuff Size: Normal)   Pulse 72   Temp (!) 97.5 F (36.4 C) (Oral)   Ht 5\' 7"  (1.702 m)   Wt 197 lb 3.2 oz (89.4 kg)   SpO2 92%   BMI 30.89 kg/m  General: Alert and oriented. Pleasant and cooperative. Well-nourished and well-developed.  Head:  Normocephalic and atraumatic. Eyes:  Without icterus, sclera clear and conjunctiva pink.  Ears:  Normal auditory acuity. Lungs:   Clear to auscultation bilaterally. No wheezes, rales, or rhonchi. No distress.  Heart:  S1, S2 present without murmurs appreciated.  Abdomen:  +BS, soft, non-tender and non-distended. No HSM noted. No guarding or rebound. No masses appreciated.  Rectal:  Deferred  Msk:  Symmetrical without gross deformities. Normal posture. Extremities:  Without edema. Neurologic:  Alert and  oriented x4;  grossly normal neurologically. Skin:  Intact without significant lesions or rashes. Psych:  Normal mood and affect.    Assessment:  89 year old male presenting today for further evaluation of alternating constipation and diarrhea.  Suspect we are dealing with baseline constipation with overflow diarrhea.  Occasional lower abdominal pain that improves once he has a good bowel movement.  No alarm symptoms.   Plan:  Start MiraLAX 17 g daily. Follow-up in 8 weeks or sooner if needed.    Ermalinda Memos, PA-C [facility] Gastroenterology 08/27/2023

## 2023-08-28 ENCOUNTER — Encounter: Payer: Self-pay | Admitting: Gastroenterology

## 2023-09-07 ENCOUNTER — Ambulatory Visit: Payer: Medicare Other | Attending: Cardiology | Admitting: Cardiology

## 2023-09-07 ENCOUNTER — Encounter: Payer: Self-pay | Admitting: Cardiology

## 2023-09-07 VITALS — BP 128/72 | HR 57 | Ht 69.0 in | Wt 198.6 lb

## 2023-09-07 DIAGNOSIS — I2510 Atherosclerotic heart disease of native coronary artery without angina pectoris: Secondary | ICD-10-CM | POA: Diagnosis not present

## 2023-09-07 DIAGNOSIS — I5032 Chronic diastolic (congestive) heart failure: Secondary | ICD-10-CM | POA: Insufficient documentation

## 2023-09-07 DIAGNOSIS — I10 Essential (primary) hypertension: Secondary | ICD-10-CM | POA: Diagnosis not present

## 2023-09-07 DIAGNOSIS — E782 Mixed hyperlipidemia: Secondary | ICD-10-CM | POA: Insufficient documentation

## 2023-09-07 NOTE — Progress Notes (Signed)
Cardiology Office Note  Date: 09/07/2023   ID: Mitchell [name], DOB 03-30-1934, MRN [id number]  History of Present Illness: Mitchell [name] is an 89 y.o. male last seen in May 2024 by Ms. Strader PA-C, I reviewed the note.  He is here today with his wife for a follow-up visit.  From a cardiac perspective, he does not report any sense of palpitations, has had no sudden dizziness or syncope.  No angina.  Mainly limited by chronic lower back pain and states that he has consultation pending at [facility].  He is using a walker and has not had any recent falls.  I went over his medications.  Current regimen includes aspirin, Norvasc, bisoprolol, Lipitor, and Lasix.  Blood pressure control is adequate today.  I did review his recent lab work from December 2024.  I reviewed his ECG today which shows sinus rhythm with prolonged PR interval, increased voltage, and PVCs.  Physical Exam: VS:  BP 128/72   Pulse (!) 57   Ht 5\' 9"  (1.753 m)   Wt 198 lb 9.6 oz (90.1 kg)   SpO2 92%   BMI 29.33 kg/m , BMI Body mass index is 29.33 kg/m.  Wt Readings from Last 3 Encounters:  09/07/23 198 lb 9.6 oz (90.1 kg)  08/27/23 197 lb 3.2 oz (89.4 kg)  01/08/23 205 lb (93 kg)    General: Patient appears comfortable at rest. HEENT: Conjunctiva and lids normal. Neck: Supple, no elevated JVP or carotid bruits. Lungs: Clear to auscultation, nonlabored breathing at rest. Cardiac: Regular rate and rhythm with occasional ectopy, no S3 or significant systolic murmur. Extremities: No pitting edema.  ECG:  An ECG dated 10/01/2022 was personally reviewed today and demonstrated:  Sinus rhythm with ventricular couplets, right bundle branch block, leftward axis.  Labwork:  December 2024: Hemoglobin A1c 6.3%, cholesterol 151, triglycerides 127, HDL 41, LDL 85, potassium 4.4, BUN 24, creatinine 1.48, AST 26, ALT 19  Other Studies Reviewed Today:  Echocardiogram 06/23/2023:  1. Left ventricular ejection fraction, by estimation,  is 55%. The left  ventricle has normal function. Left ventricular endocardial border not  optimally defined to evaluate regional wall motion. Left ventricular  diastolic parameters are consistent with  Grade I diastolic dysfunction (impaired relaxation). Normal LVEDP.   2. Right ventricular systolic function is normal. The right ventricular  size is normal. Tricuspid regurgitation signal is inadequate for assessing  PA pressure.   3. The mitral valve is normal in structure. Trivial mitral valve  regurgitation. No evidence of mitral stenosis.   4. The aortic valve is tricuspid. There is moderate calcification of the  aortic valve. Aortic valve regurgitation is trivial. No aortic stenosis is  present.   5. Aortic dilatation noted. There is borderline dilatation of the aortic  root, measuring 39 mm. There is mild dilatation of the ascending aorta,  measuring 42 mm.   Assessment and Plan:  1.  CAD, moderate nonobstructive disease by cardiac catheterization in 2021.  He does not describe any angina.  Plan to continue medical therapy which now includes aspirin and Lipitor.  2.  Primary hypertension.  Blood pressure control is adequate today.  Continue Norvasc.  3.  Mixed hyperlipidemia.  Continue Lipitor.  He follows with PCP, most recent LDL 85.  4.  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  Continue bisoprolol and Lasix.  5.  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  Disposition:  Follow up  6 months.  Signed,  Jonelle Sidle, M.D., F.A.C.C. [facility]

## 2023-09-07 NOTE — Patient Instructions (Addendum)

## 2023-10-21 NOTE — Progress Notes (Unsigned)
 Referring Provider: Kathlee Nations, MD Primary Care Physician:  Kathlee Nations, MD Primary GI Physician: Dr. Jena Gauss  No chief complaint on file.   HPI:   Mitchell [name] is a 89 y.o. male seen today for follow-up of alternating constipation and diarrhea.  Last seen in the office 08/27/2023.  Patient reported going up to 1 week without a bowel movement, but then having a bout of diarrhea once his bowel was moved.  Have about 3 bowel movements, then he can go several days without a bowel movement.  Suspected we were dealing with baseline constipation with overflow diarrhea.  He had no alarm symptoms.  Recommended starting MiraLAX daily and following up in 8 weeks.  Today:   Past Medical History:  Diagnosis Date   Allergic rhinitis    CAD (coronary artery disease)    Moderate, nonobstructive disease   CKD (chronic kidney disease)    Dupuytren's contracture    Right hand   Essential hypertension    GERD (gastroesophageal reflux disease)    Gout    Heart failure (HCC)    Herpes zoster    twice   Hyperlipidemia    Idiopathic scoliosis in adult patient    Inguinal hernia    Right sided   Internal hemorrhoids    Lumbosacral spondylosis    Neuropathy    Pre-diabetes    Prostate cancer (HCC)    with bone mets- has been treated with chemo    Past Surgical History:  Procedure Laterality Date   CORONARY PRESSURE/FFR STUDY N/A 12/08/2019   Procedure: INTRAVASCULAR PRESSURE WIRE/FFR STUDY;  Surgeon: Marykay Lex, MD;  [facility];  Service: Cardiovascular;  Laterality: N/A;   EYE SURGERY Bilateral    cataract   HERNIA REPAIR Bilateral    KYPHOPLASTY N/A 03/14/2021   Procedure: Thoracic Twelve  KYPHOPLASTY;  Surgeon: Maeola Harman, MD;  Location[facility];  Service: Neurosurgery;  Laterality: N/A;  3C/RM 21   LEFT HEART CATH AND CORONARY ANGIOGRAPHY N/A 12/08/2019   Procedure: LEFT HEART CATH AND CORONARY ANGIOGRAPHY;  Surgeon: Marykay Lex, MD;  Location: [facility]  INVASIVE CV LAB;  Service: Cardiovascular;  Laterality: N/A;   TYMPANIC MEMBRANE REPAIR     VASECTOMY      Current Outpatient Medications  Medication Sig Dispense Refill   acetaminophen (TYLENOL) 500 MG tablet Take 1,000 mg by mouth every 6 (six) hours as needed.     allopurinol (ZYLOPRIM) 100 MG tablet Take 200 mg by mouth daily.      amLODipine (NORVASC) 2.5 MG tablet Take 2.5 mg by mouth daily.     aspirin EC 81 MG tablet Take 81 mg by mouth daily.     atorvastatin (LIPITOR) 10 MG tablet TAKE 1 TABLET BY MOUTH EVERY DAY 90 tablet 3   bisoprolol (ZEBETA) 5 MG tablet TAKE 1 TABLET BY MOUTH EVERY DAY 90 tablet 2   colchicine 0.6 MG tablet Take 0.6 mg by mouth 2 (two) times daily as needed (gout).     fluticasone (FLONASE) 50 MCG/ACT nasal spray Place 2 sprays into both nostrils daily as needed for allergies or rhinitis.     furosemide (LASIX) 20 MG tablet Take 20 mg ( 1 tablet) alternating with 40 mg ( 2 tablets) daily 45 tablet 11   gabapentin (NEURONTIN) 300 MG capsule Take 600 mg by mouth 3 (three) times daily.      loratadine (CLARITIN) 10 MG tablet Take by mouth as needed for allergies.  meclizine (ANTIVERT) 25 MG tablet Take 25 mg by mouth 3 (three) times daily as needed for dizziness.     Multiple Vitamins-Minerals (MULTIVITAMIN WITH MINERALS) tablet Take 1 tablet by mouth daily.     predniSONE (DELTASONE) 5 MG tablet Take 5 mg by mouth daily with breakfast.     Propylene Glycol (SYSTANE BALANCE) 0.6 % SOLN Place 1 drop into both eyes daily as needed (dry eyes).     No current facility-administered medications for this visit.    Allergies as of 10/22/2023 - Review Complete 09/07/2023  Allergen Reaction Noted   Lisinopril Cough 11/01/2019   Losartan Cough 11/01/2019   Penicillins Rash 11/01/2019    Family History  Problem Relation Age of Onset   Heart failure Mother    Heart failure Father    CAD Father    Cancer Sister    CAD Brother    Stroke Brother    Neuropathy  Neg Hx     Social History   Socioeconomic History   Marital status: Married    Spouse name: Not on file   Number of children: 3   Years of education: 14   Highest education level: Not on file  Occupational History   Not on file  Tobacco Use   Smoking status: Never   Smokeless tobacco: Never  Vaping Use   Vaping status: Never Used  Substance and Sexual Activity   Alcohol use: Not Currently    Alcohol/week: 0.0 - 1.0 standard drinks of alcohol    Comment: "at best"   Drug use: Never   Sexual activity: Not on file  Other Topics Concern   Not on file  Social History Narrative   Lives with wife at home    Right handed   Caffeine: 1 cup/day   Social Drivers of Corporate investment banker Strain: Low Risk  (10/13/2023)   Received from [facility]   Overall Financial Resource Strain (CARDIA)    Difficulty of Paying Living Expenses: Not hard at all  Food Insecurity: No Food Insecurity (10/13/2023)   Received from [facility]   Hunger Vital Sign    Worried About Running Out of Food in the Last Year: Never true    Ran Out of Food in the Last Year: Never true  Transportation Needs: No Transportation Needs (10/13/2023)   Received from [facility] - Transportation    Lack of Transportation (Medical): No    Lack of Transportation (Non-Medical): No  Physical Activity: Inactive (10/13/2023)   Received from [facility]   Exercise Vital Sign    Days of Exercise per Week: 0 days    Minutes of Exercise per Session: 0 min  Stress: No Stress Concern Present (10/13/2023)   Received from [facility] of Occupational Health - Occupational Stress Questionnaire    Feeling of Stress : Not at all  Social Connections: Moderately Integrated (10/13/2023)   Received from [facility]   Social Connection and Isolation Panel [NHANES]    Frequency of Communication with Friends and Family: More than three times a week    Frequency of Social Gatherings  with Friends and Family: Once a week    Attends Religious Services: More than 4 times per year    Active Member of [employer] or Organizations: No    Attends Banker Meetings: Never    Marital Status: Married    Review of Systems: Gen: Denies fever, chills, anorexia. Denies fatigue, weakness, weight loss.  CV: Denies chest pain, palpitations, syncope, peripheral edema, and claudication. Resp: Denies dyspnea at rest, cough, wheezing, coughing up blood, and pleurisy. GI: Denies vomiting blood, jaundice, and fecal incontinence.   Denies dysphagia or odynophagia. Derm: Denies rash, itching, dry skin Psych: Denies depression, anxiety, memory loss, confusion. No homicidal or suicidal ideation.  Heme: Denies bruising, bleeding, and enlarged lymph nodes.  Physical Exam: There were no vitals taken for this visit. General:   Alert and oriented. No distress noted. Pleasant and cooperative.  Head:  Normocephalic and atraumatic. Eyes:  Conjuctiva clear without scleral icterus. Heart:  S1, S2 present without murmurs appreciated. Lungs:  Clear to auscultation bilaterally. No wheezes, rales, or rhonchi. No distress.  Abdomen:  +BS, soft, non-tender and non-distended. No rebound or guarding. No HSM or masses noted. Msk:  Symmetrical without gross deformities. Normal posture. Extremities:  Without edema. Neurologic:  Alert and  oriented x4 Psych:  Normal mood and affect.    Assessment:     Plan:  ***   Ermalinda Memos, PA-C [facility] 10/22/2023

## 2023-10-22 ENCOUNTER — Encounter: Payer: Self-pay | Admitting: Gastroenterology

## 2023-10-22 ENCOUNTER — Ambulatory Visit (INDEPENDENT_AMBULATORY_CARE_PROVIDER_SITE_OTHER): Payer: Medicare Other | Admitting: Gastroenterology

## 2023-10-22 VITALS — BP 131/78 | HR 67 | Temp 97.9°F | Ht 68.0 in | Wt 175.6 lb

## 2023-10-22 DIAGNOSIS — K5900 Constipation, unspecified: Secondary | ICD-10-CM | POA: Diagnosis not present

## 2023-10-22 DIAGNOSIS — R197 Diarrhea, unspecified: Secondary | ICD-10-CM

## 2023-10-22 DIAGNOSIS — R198 Other specified symptoms and signs involving the digestive system and abdomen: Secondary | ICD-10-CM

## 2023-10-22 NOTE — Patient Instructions (Signed)
 Increase MiraLAX to 3/4 capful daily.  After couple weeks, if this is not allowing you to have productive bowel movements every day, go ahead and increase back to 1 capful of MiraLAX every single day.  As we discussed, you may have diarrhea initially as a "washout" period that I suspect will taper off in 1-2 weeks. I recommend you keep on with MiraLAX daily even if this happens.   I will plan to see you back in 8 weeks or sooner if needed.   Ermalinda Memos, PA-C [facility]

## 2023-12-05 ENCOUNTER — Other Ambulatory Visit: Payer: Self-pay | Admitting: Student

## 2023-12-05 ENCOUNTER — Other Ambulatory Visit: Payer: Self-pay | Admitting: Cardiology

## 2023-12-17 ENCOUNTER — Ambulatory Visit: Admitting: Gastroenterology

## 2023-12-27 NOTE — Progress Notes (Deleted)
 Referring Provider: Lenda Quan, MD Primary Care Physician:  Lenda Quan, MD Primary GI Physician: Dr. Jenney Modest chief complaint on file.   HPI:   Mitchell [name] is a 89 y.o. male presenting today for follow-up of alternating constipation and diarrhea.   He was initially seen for the symptoms on 08/27/2023 reporting going up to 1 week without a bowel movement, then having a bout of diarrhea with about 3 BMs in 24 hours.  Recommended starting MiraLAX  daily at that time.  Last seen in the office 10/22/2023 reporting he took MiraLAX  daily for about 1 week and stopped because it was causing diarrhea with 2 or 3 diarrhea type stools per day.  Resume taking MiraLAX  one half capful most days and was having a bowel movement every 3 to 4 days.  Recommended increasing MiraLAX  to three fourths capful daily and if not having bowel movements daily, go ahead and take 1 capful daily.  Encouraged that he stick with MiraLAX  every day even if developing some diarrhea for 1 to 2 weeks as I suspected this would taper off.  Today:   Past Medical History:  Diagnosis Date   Allergic rhinitis    CAD (coronary artery disease)    Moderate, nonobstructive disease   CKD (chronic kidney disease)    Dupuytren's contracture    Right hand   Essential hypertension    GERD (gastroesophageal reflux disease)    Gout    Heart failure (HCC)    Herpes zoster    twice   Hyperlipidemia    Idiopathic scoliosis in adult patient    Inguinal hernia    Right sided   Internal hemorrhoids    Lumbosacral spondylosis    Neuropathy    Pre-diabetes    Prostate cancer (HCC)    with bone mets- has been treated with chemo    Past Surgical History:  Procedure Laterality Date   CORONARY PRESSURE/FFR STUDY N/A 12/08/2019   Procedure: INTRAVASCULAR PRESSURE WIRE/FFR STUDY;  Surgeon: Arleen Lacer, MD;  Location: [facility] INVASIVE CV LAB;  Service: Cardiovascular;  Laterality: N/A;   EYE SURGERY Bilateral    cataract   HERNIA  REPAIR Bilateral    KYPHOPLASTY N/A 03/14/2021   Procedure: Thoracic Twelve  KYPHOPLASTY;  Surgeon: Manya Sells, MD;  Location: [facility] OR;  Service: Neurosurgery;  Laterality: N/A;  3C/RM 21   LEFT HEART CATH AND CORONARY ANGIOGRAPHY N/A 12/08/2019   Procedure: LEFT HEART CATH AND CORONARY ANGIOGRAPHY;  Surgeon: Arleen Lacer, MD;  Location: [facility] INVASIVE CV LAB;  Service: Cardiovascular;  Laterality: N/A;   TYMPANIC MEMBRANE REPAIR     VASECTOMY      Current Outpatient Medications  Medication Sig Dispense Refill   acetaminophen  (TYLENOL ) 500 MG tablet Take 1,000 mg by mouth every 6 (six) hours as needed.     allopurinol  (ZYLOPRIM ) 100 MG tablet Take 200 mg by mouth daily.      amLODipine  (NORVASC ) 2.5 MG tablet Take 2.5 mg by mouth daily.     aspirin  EC 81 MG tablet Take 81 mg by mouth daily.     atorvastatin  (LIPITOR) 10 MG tablet TAKE 1 TABLET BY MOUTH EVERY DAY 90 tablet 3   bisoprolol  (ZEBETA ) 5 MG tablet TAKE 1 TABLET BY MOUTH EVERY DAY 90 tablet 3   colchicine  0.6 MG tablet Take 0.6 mg by mouth 2 (two) times daily as needed (gout).     fluticasone  (FLONASE ) 50 MCG/ACT nasal spray Place 2 sprays into both nostrils  daily as needed for allergies or rhinitis.     furosemide  (LASIX ) 20 MG tablet TAKE 20 MG ( 1 TABLET) ALTERNATING WITH 40 MG ( 2 TABLETS) DAILY 135 tablet 0   gabapentin  (NEURONTIN ) 300 MG capsule Take 600 mg by mouth 3 (three) times daily.      loratadine (CLARITIN) 10 MG tablet Take by mouth as needed for allergies.     meclizine  (ANTIVERT ) 25 MG tablet Take 25 mg by mouth 3 (three) times daily as needed for dizziness.     Multiple Vitamins-Minerals (MULTIVITAMIN WITH MINERALS) tablet Take 1 tablet by mouth daily.     predniSONE  (DELTASONE ) 5 MG tablet Take 5 mg by mouth daily with breakfast.     Propylene Glycol (SYSTANE BALANCE) 0.6 % SOLN Place 1 drop into both eyes daily as needed (dry eyes).     No current facility-administered medications for this visit.     Allergies as of 12/28/2023 - Review Complete 10/22/2023  Allergen Reaction Noted   Lisinopril Cough 11/01/2019   Losartan Cough 11/01/2019   Penicillins Rash 11/01/2019    Family History  Problem Relation Age of Onset   Heart failure Mother    Heart failure Father    CAD Father    Cancer Sister    CAD Brother    Stroke Brother    Neuropathy Neg Hx     Social History   Socioeconomic History   Marital status: Married    Spouse name: Not on file   Number of children: 3   Years of education: 14   Highest education level: Not on file  Occupational History   Not on file  Tobacco Use   Smoking status: Never   Smokeless tobacco: Never  Vaping Use   Vaping status: Never Used  Substance and Sexual Activity   Alcohol use: Not Currently    Alcohol/week: 0.0 - 1.0 standard drinks of alcohol    Comment: "at best"   Drug use: Never   Sexual activity: Not on file  Other Topics Concern   Not on file  Social History Narrative   Lives with wife at home    Right handed   Caffeine: 1 cup/day   Social Drivers of Corporate investment banker Strain: Low Risk  (11/13/2023)   Received from [facility]   Overall Financial Resource Strain (CARDIA)    Difficulty of Paying Living Expenses: Not hard at all  Food Insecurity: No Food Insecurity (11/13/2023)   Received [facility]   Hunger Vital Sign    Worried About Running Out of Food in the Last Year: Never true    Ran Out of Food in the Last Year: Never true  Transportation Needs: No Transportation Needs (11/13/2023)   Received from [facility] - Transportation    In the past 12 months, has lack of transportation kept you from medical appointments or from getting medications?: No    Lack of Transportation (Non-Medical): No  Physical Activity: Inactive (10/13/2023)   Received from [facility]   Exercise Vital Sign    Days of Exercise per Week: 0 days    Minutes  of Exercise per Session: 0 min  Stress: No Stress Concern Present (10/13/2023)   Received from [facility] of Occupational Health - Occupational Stress Questionnaire    Feeling of Stress : Not at all  Social Connections: Moderately Integrated (10/13/2023)   Received from [facility]  Social Advertising account executive [NHANES]    Frequency of Communication with Friends and Family: More than three times a week    Frequency of Social Gatherings with Friends and Family: Once a week    Attends Religious Services: More than 4 times per year    Active Member of [employer] or Organizations: No    Attends Banker Meetings: Never    Marital Status: Married    Review of Systems: Gen: Denies fever, chills, anorexia. Denies fatigue, weakness, weight loss.  CV: Denies chest pain, palpitations, syncope, peripheral edema, and claudication. Resp: Denies dyspnea at rest, cough, wheezing, coughing up blood, and pleurisy. GI: Denies vomiting blood, jaundice, and fecal incontinence.   Denies dysphagia or odynophagia. Derm: Denies rash, itching, dry skin Psych: Denies depression, anxiety, memory loss, confusion. No homicidal or suicidal ideation.  Heme: Denies bruising, bleeding, and enlarged lymph nodes.  Physical Exam: There were no vitals taken for this visit. General:   Alert and oriented. No distress noted. Pleasant and cooperative.  Head:  Normocephalic and atraumatic. Eyes:  Conjuctiva clear without scleral icterus. Heart:  S1, S2 present without murmurs appreciated. Lungs:  Clear to auscultation bilaterally. No wheezes, rales, or rhonchi. No distress.  Abdomen:  +BS, soft, non-tender and non-distended. No rebound or guarding. No HSM or masses noted. Msk:  Symmetrical without gross deformities. Normal posture. Extremities:  Without edema. Neurologic:  Alert and  oriented x4 Psych:  Normal mood and affect.    Assessment:     Plan:  ***   Shana Daring, PA-C [facility]enter[facility] 12/28/2023

## 2023-12-28 ENCOUNTER — Ambulatory Visit: Admitting: Gastroenterology

## 2023-12-30 ENCOUNTER — Ambulatory Visit: Admitting: Gastroenterology

## 2024-01-09 NOTE — Progress Notes (Unsigned)
 Referring Provider: Lenda Quan, MD Primary Care Physician:  Lenda Quan, MD Primary GI Physician: Dr. Riley Cheadle  No chief complaint on file.   HPI:   Mitchell [name] is a 89 y.o. male presenting today for follow-up of alternating constipation and diarrhea.  Initial consult 08/27/2023 for alternating constipation and diarrhea reporting going up to 1 week without a bowel movement, then having a bout of diarrhea, then back to constipation.  Recommended MiraLAX  daily.  Last office visit 10/22/2023.  Reported taking MiraLAX  for about 1 week and stopped as it was causing diarrhea.  Would have 2-3 diarrhea type stools per day.  Recently resumed MiraLAX  one half capful most days and was having a bowel movement every 3 to 4 days.  Recommended increasing MiraLAX  to 3/4 capful daily. If this does not allow for daily bowel movements, increase to 1 capful daily.   Today:   Past Medical History:  Diagnosis Date   Allergic rhinitis    CAD (coronary artery disease)    Moderate, nonobstructive disease   CKD (chronic kidney disease)    Dupuytren's contracture    Right hand   Essential hypertension    GERD (gastroesophageal reflux disease)    Gout    Heart failure (HCC)    Herpes zoster    twice   Hyperlipidemia    Idiopathic scoliosis in adult patient    Inguinal hernia    Right sided   Internal hemorrhoids    Lumbosacral spondylosis    Neuropathy    Pre-diabetes    Prostate cancer (HCC)    with bone mets- has been treated with chemo    Past Surgical History:  Procedure Laterality Date   CORONARY PRESSURE/FFR STUDY N/A 12/08/2019   Procedure: INTRAVASCULAR PRESSURE WIRE/FFR STUDY;  Surgeon: Arleen Lacer, MD;  Location: [facility] INVASIVE CV LAB;  Service: Cardiovascular;  Laterality: N/A;   EYE SURGERY Bilateral    cataract   HERNIA REPAIR Bilateral    KYPHOPLASTY N/A 03/14/2021   Procedure: Thoracic Twelve  KYPHOPLASTY;  Surgeon: Manya Sells, MD;  Location: [facility] OR;  Service: Neurosurgery;   Laterality: N/A;  3C/RM 21   LEFT HEART CATH AND CORONARY ANGIOGRAPHY N/A 12/08/2019   Procedure: LEFT HEART CATH AND CORONARY ANGIOGRAPHY;  Surgeon: Arleen Lacer, MD;  Location: [facility] INVASIVE CV LAB;  Service: Cardiovascular;  Laterality: N/A;   TYMPANIC MEMBRANE REPAIR     VASECTOMY      Current Outpatient Medications  Medication Sig Dispense Refill   acetaminophen  (TYLENOL ) 500 MG tablet Take 1,000 mg by mouth every 6 (six) hours as needed.     allopurinol  (ZYLOPRIM ) 100 MG tablet Take 200 mg by mouth daily.      amLODipine  (NORVASC ) 2.5 MG tablet Take 2.5 mg by mouth daily.     aspirin  EC 81 MG tablet Take 81 mg by mouth daily.     atorvastatin  (LIPITOR) 10 MG tablet TAKE 1 TABLET BY MOUTH EVERY DAY 90 tablet 3   bisoprolol  (ZEBETA ) 5 MG tablet TAKE 1 TABLET BY MOUTH EVERY DAY 90 tablet 3   colchicine  0.6 MG tablet Take 0.6 mg by mouth 2 (two) times daily as needed (gout).     fluticasone  (FLONASE ) 50 MCG/ACT nasal spray Place 2 sprays into both nostrils daily as needed for allergies or rhinitis.     furosemide  (LASIX ) 20 MG tablet TAKE 20 MG ( 1 TABLET) ALTERNATING WITH 40 MG ( 2 TABLETS) DAILY 135 tablet 0   gabapentin  (NEURONTIN ) 300  MG capsule Take 600 mg by mouth 3 (three) times daily.      loratadine (CLARITIN) 10 MG tablet Take by mouth as needed for allergies.     meclizine  (ANTIVERT ) 25 MG tablet Take 25 mg by mouth 3 (three) times daily as needed for dizziness.     Multiple Vitamins-Minerals (MULTIVITAMIN WITH MINERALS) tablet Take 1 tablet by mouth daily.     predniSONE  (DELTASONE ) 5 MG tablet Take 5 mg by mouth daily with breakfast.     Propylene Glycol (SYSTANE BALANCE) 0.6 % SOLN Place 1 drop into both eyes daily as needed (dry eyes).     No current facility-administered medications for this visit.    Allergies as of 01/11/2024 - Review Complete 10/22/2023  Allergen Reaction Noted   Lisinopril Cough 11/01/2019   Losartan Cough 11/01/2019   Penicillins Rash  11/01/2019    Family History  Problem Relation Age of Onset   Heart failure Mother    Heart failure Father    CAD Father    Cancer Sister    CAD Brother    Stroke Brother    Neuropathy Neg Hx     Social History   Socioeconomic History   Marital status: Married    Spouse name: Not on file   Number of children: 3   Years of education: 14   Highest education level: Not on file  Occupational History   Not on file  Tobacco Use   Smoking status: Never   Smokeless tobacco: Never  Vaping Use   Vaping status: Never Used  Substance and Sexual Activity   Alcohol use: Not Currently    Alcohol/week: 0.0 - 1.0 standard drinks of alcohol    Comment: "at best"   Drug use: Never   Sexual activity: Not on file  Other Topics Concern   Not on file  Social History Narrative   Lives with wife at home    Right handed   Caffeine: 1 cup/day   Social Drivers of Corporate investment banker Strain: Low Risk  (11/13/2023)   Received from [facility]   Overall Financial Resource Strain (CARDIA)    Difficulty of Paying Living Expenses: Not hard at all  Food Insecurity: No Food Insecurity (11/13/2023)   Received from [facility]   Hunger Vital Sign    Worried About Running Out of Food in the Last Year: Never true    Ran Out of Food in the Last Year: Never true  Transportation Needs: No Transportation Needs (11/13/2023)   Received from [facility] - Transportation    In the past 12 months, has lack of transportation kept you from medical appointments or from getting medications?: No    Lack of Transportation (Non-Medical): No  Physical Activity: Inactive (10/13/2023)   Received from [facility]   Exercise Vital Sign    Days of Exercise per Week: 0 days    Minutes of Exercise per Session: 0 min  Stress: No Stress Concern Present (10/13/2023)   Received from [facility] of Occupational Health -  Occupational Stress Questionnaire    Feeling of Stress : Not at all  Social Connections: Moderately Integrated (10/13/2023)   Received from [facility]   Social Connection and Isolation Panel [NHANES]    Frequency of Communication with Friends and Family: More than three times a week    Frequency of Social Gatherings with Friends and Family: Once a week  Attends Religious Services: More than 4 times per year    Active Member of Clubs or Organizations: No    Attends Banker Meetings: Never    Marital Status: Married    Review of Systems: Gen: Denies fever, chills, anorexia. Denies fatigue, weakness, weight loss.  CV: Denies chest pain, palpitations, syncope, peripheral edema, and claudication. Resp: Denies dyspnea at rest, cough, wheezing, coughing up blood, and pleurisy. GI: Denies vomiting blood, jaundice, and fecal incontinence.   Denies dysphagia or odynophagia. Derm: Denies rash, itching, dry skin Psych: Denies depression, anxiety, memory loss, confusion. No homicidal or suicidal ideation.  Heme: Denies bruising, bleeding, and enlarged lymph nodes.  Physical Exam: There were no vitals taken for this visit. General:   Alert and oriented. No distress noted. Pleasant and cooperative.  Head:  Normocephalic and atraumatic. Eyes:  Conjuctiva clear without scleral icterus. Heart:  S1, S2 present without murmurs appreciated. Lungs:  Clear to auscultation bilaterally. No wheezes, rales, or rhonchi. No distress.  Abdomen:  +BS, soft, non-tender and non-distended. No rebound or guarding. No HSM or masses noted. Msk:  Symmetrical without gross deformities. Normal posture. Extremities:  Without edema. Neurologic:  Alert and  oriented x4 Psych:  Normal mood and affect.    Assessment:     Plan:  ***   Shana Daring, PA-C [facility] Gastroenter[facility] 01/11/2024

## 2024-01-11 ENCOUNTER — Encounter: Payer: Self-pay | Admitting: Gastroenterology

## 2024-01-11 ENCOUNTER — Ambulatory Visit (INDEPENDENT_AMBULATORY_CARE_PROVIDER_SITE_OTHER): Admitting: Gastroenterology

## 2024-01-11 VITALS — BP 107/63 | HR 73 | Temp 97.7°F | Ht 67.0 in | Wt 200.6 lb

## 2024-01-11 DIAGNOSIS — R197 Diarrhea, unspecified: Secondary | ICD-10-CM | POA: Diagnosis not present

## 2024-01-11 DIAGNOSIS — K5900 Constipation, unspecified: Secondary | ICD-10-CM

## 2024-01-11 NOTE — Patient Instructions (Addendum)
 Continue eating plenty of fruits and vegetables on a daily basis to help maintain adequate fiber intake.  You can try eating prunes daily or a few times a week to help with your bowel movements as well.  I recommend that you increase your daily water consumption.  The goal is 64 ounces of water per day.  If you continue to skip a couple days between bowel movements, try adding Benefiber 1 tablespoon daily for 2 weeks, then increase to twice daily.  I will plan to see you back in the office in 6 months or sooner if needed.  Shana Daring, PA-C [facility]

## 2024-05-16 ENCOUNTER — Ambulatory Visit: Attending: Cardiology | Admitting: Cardiology

## 2024-05-16 ENCOUNTER — Encounter: Payer: Self-pay | Admitting: Cardiology

## 2024-05-16 VITALS — BP 122/80 | HR 69 | Ht 68.0 in | Wt 197.8 lb

## 2024-05-16 DIAGNOSIS — I2510 Atherosclerotic heart disease of native coronary artery without angina pectoris: Secondary | ICD-10-CM | POA: Diagnosis not present

## 2024-05-16 DIAGNOSIS — I10 Essential (primary) hypertension: Secondary | ICD-10-CM | POA: Diagnosis not present

## 2024-05-16 DIAGNOSIS — E782 Mixed hyperlipidemia: Secondary | ICD-10-CM | POA: Diagnosis not present

## 2024-05-16 DIAGNOSIS — I5020 Unspecified systolic (congestive) heart failure: Secondary | ICD-10-CM | POA: Diagnosis present

## 2024-05-16 NOTE — Progress Notes (Signed)
    Cardiology Office Note  Date: 05/16/2024   ID: THANE AGE, DOB 04-20-1934, MRN [id number]  History of Present Illness: Mitchell [name] is an 89 y.o. male last seen in January.  He is here today with his wife for a follow-up visit.  Reports no exertional chest pain, no palpitations or syncope.  He is limited by chronic back pain, using a rolling walker, and doing reasonably well now after recent pain control injection at [facility].  He also remains on chronic steroids.  I reviewed his medications, no change from a cardiovascular perspective.  He had lab work with PCP back in June which I also reviewed.  I rechecked his blood pressure today at 122/80.  Physical Exam: VS:  BP 122/80 (BP Location: Left Arm)   Pulse 69   Ht 5' 8 (1.727 m)   Wt 197 lb 12.8 oz (89.7 kg)   SpO2 96%   BMI 30.08 kg/m , BMI Body mass index is 30.08 kg/m.  Wt Readings from Last 3 Encounters:  05/16/24 197 lb 12.8 oz (89.7 kg)  01/11/24 200 lb 9.6 oz (91 kg)  10/22/23 175 lb 9.6 oz (79.7 kg)    General: Patient appears comfortable at rest. HEENT: Conjunctiva and lids normal. Neck: Supple, no elevated JVP or carotid bruits. Lungs: Clear to auscultation, nonlabored breathing at rest. Cardiac: Regular rate and rhythm, no S3 or significant systolic murmur. Extremities: No pitting edema.  ECG:  An ECG dated 09/07/2023 was personally reviewed today and demonstrated:  Sinus rhythm with prolonged PR interval, increased voltage, PVCs.  Labwork:  June 2025: Hemoglobin A1c 6.1%, TSH 3.116, cholesterol 150, triglycerides 121, HDL 42, LDL 84, potassium 4.4, BUN 29, creatinine 1.67, GFR 38, AST 21, ALT 13, hemoglobin 13.8, platelets 238  Other Studies Reviewed Today:  No interval cardiac testing for review today.  Assessment and Plan:  1.  CAD, moderate nonobstructive disease by cardiac catheterization in 2021.  He reports no angina at current level of activity.  Continue observation on medical therapy including  aspirin  81 mg daily and Lipitor 10 mg daily.   2.  Primary hypertension.  Continue present regimen including Norvasc  2.5 mg daily and bisoprolol  5 mg daily   3.  Mixed hyperlipidemia.  LDL 84 in June.  Continue Lipitor 10 mg daily.   4.  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  In addition to the above medications he is also on Lasix  20 mg daily with no progressive fluid retention and stable weight.   5.  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  Disposition:  Follow up 6 months.  Signed, Jayson JUDITHANN Sierras, M.D., F.A.C.C. [facility]

## 2024-05-16 NOTE — Patient Instructions (Addendum)

## 2024-05-25 ENCOUNTER — Encounter: Payer: Self-pay | Admitting: Gastroenterology

## 2024-08-13 ENCOUNTER — Other Ambulatory Visit: Payer: Self-pay | Admitting: Cardiology

## 2024-11-02 ENCOUNTER — Ambulatory Visit: Admitting: Cardiology
